# Patient Record
Sex: Female | Born: 1980 | Race: Black or African American | Hispanic: No | Marital: Married | Smoking: Former smoker
Health system: Southern US, Community
[De-identification: ages and names within clinical notes are randomized; demographics above are authoritative.]

## PROBLEM LIST (undated history)

## (undated) DIAGNOSIS — K219 Gastro-esophageal reflux disease without esophagitis: Secondary | ICD-10-CM

## (undated) DIAGNOSIS — F32A Depression, unspecified: Secondary | ICD-10-CM

## (undated) DIAGNOSIS — N898 Other specified noninflammatory disorders of vagina: Secondary | ICD-10-CM

## (undated) DIAGNOSIS — F329 Major depressive disorder, single episode, unspecified: Secondary | ICD-10-CM

## (undated) DIAGNOSIS — S83511A Sprain of anterior cruciate ligament of right knee, initial encounter: Secondary | ICD-10-CM

## (undated) DIAGNOSIS — D219 Benign neoplasm of connective and other soft tissue, unspecified: Secondary | ICD-10-CM

## (undated) DIAGNOSIS — F4310 Post-traumatic stress disorder, unspecified: Secondary | ICD-10-CM

## (undated) DIAGNOSIS — F419 Anxiety disorder, unspecified: Secondary | ICD-10-CM

## (undated) HISTORY — DX: Benign neoplasm of connective and other soft tissue, unspecified: D21.9

## (undated) HISTORY — DX: Other specified noninflammatory disorders of vagina: N89.8

## (undated) HISTORY — DX: Depression, unspecified: F32.A

## (undated) HISTORY — PX: WISDOM TOOTH EXTRACTION: SHX21

## (undated) HISTORY — DX: Major depressive disorder, single episode, unspecified: F32.9

## (undated) HISTORY — DX: Gastro-esophageal reflux disease without esophagitis: K21.9

## (undated) HISTORY — DX: Post-traumatic stress disorder, unspecified: F43.10

---

## 2000-07-09 ENCOUNTER — Other Ambulatory Visit: Admission: RE | Admit: 2000-07-09 | Discharge: 2000-07-09 | Payer: Self-pay | Admitting: Obstetrics & Gynecology

## 2000-07-09 ENCOUNTER — Encounter: Admission: RE | Admit: 2000-07-09 | Discharge: 2000-07-09 | Payer: Self-pay | Admitting: Obstetrics & Gynecology

## 2001-12-02 ENCOUNTER — Other Ambulatory Visit: Admission: RE | Admit: 2001-12-02 | Discharge: 2001-12-02 | Payer: Self-pay | Admitting: Obstetrics and Gynecology

## 2002-02-11 ENCOUNTER — Ambulatory Visit (HOSPITAL_COMMUNITY): Admission: RE | Admit: 2002-02-11 | Discharge: 2002-02-11 | Payer: Self-pay | Admitting: Obstetrics and Gynecology

## 2002-02-11 ENCOUNTER — Encounter: Payer: Self-pay | Admitting: Obstetrics and Gynecology

## 2002-03-03 ENCOUNTER — Inpatient Hospital Stay (HOSPITAL_COMMUNITY): Admission: AD | Admit: 2002-03-03 | Discharge: 2002-03-03 | Payer: Self-pay | Admitting: Obstetrics and Gynecology

## 2002-06-09 ENCOUNTER — Inpatient Hospital Stay (HOSPITAL_COMMUNITY): Admission: AD | Admit: 2002-06-09 | Discharge: 2002-06-12 | Payer: Self-pay | Admitting: Obstetrics and Gynecology

## 2003-07-26 ENCOUNTER — Other Ambulatory Visit: Admission: RE | Admit: 2003-07-26 | Discharge: 2003-07-26 | Payer: Self-pay | Admitting: Obstetrics and Gynecology

## 2004-08-09 ENCOUNTER — Other Ambulatory Visit: Admission: RE | Admit: 2004-08-09 | Discharge: 2004-08-09 | Payer: Self-pay | Admitting: Obstetrics and Gynecology

## 2006-01-09 ENCOUNTER — Other Ambulatory Visit: Admission: RE | Admit: 2006-01-09 | Discharge: 2006-01-09 | Payer: Self-pay | Admitting: Obstetrics and Gynecology

## 2007-12-30 ENCOUNTER — Emergency Department (HOSPITAL_COMMUNITY): Admission: EM | Admit: 2007-12-30 | Discharge: 2007-12-31 | Payer: Self-pay | Admitting: Emergency Medicine

## 2010-02-28 ENCOUNTER — Emergency Department (HOSPITAL_COMMUNITY): Admission: EM | Admit: 2010-02-28 | Discharge: 2010-02-28 | Payer: Self-pay | Admitting: Emergency Medicine

## 2010-08-17 ENCOUNTER — Encounter (INDEPENDENT_AMBULATORY_CARE_PROVIDER_SITE_OTHER): Payer: Self-pay | Admitting: *Deleted

## 2010-08-31 NOTE — Letter (Signed)
Summary: New Patient letter  [facility]  [address] [place], [place] [address]   Phone: [phone]  Fax: [phone]       08/17/2010 MRN: [id number]  Donna Aguilar [address] [place], [place]  [address]  Dear Ms. Donna Aguilar,  Welcome to the Gastroenterology Division at [facility].    You are scheduled to see Dr.  Russella Dar on 09-22-10 at 2:30p.m. on the 3rd floor at [facility], [address]  We ask that you try to arrive at our office 15 minutes prior to your appointment time to allow for check-in.  We would like you to complete the enclosed self-administered evaluation form prior to your visit and bring it with you on the day of your appointment.  We will review it with you.  Also, please bring a complete list of all your medications or, if you prefer, bring the medication bottles and we will list them.  Please bring your insurance card so that we may make a copy of it.  If your insurance requires a referral to see a specialist, please bring your referral form from your primary care physician.  Co-payments are due at the time of your visit and may be paid by cash, check or credit card.     Your office visit will consist of a consult with your physician (includes a physical exam), any laboratory testing he/she may order, scheduling of any necessary diagnostic testing (e.g. x-ray, ultrasound, CT-scan), and scheduling of a procedure (e.g. Endoscopy, Colonoscopy) if required.  Please allow enough time on your schedule to allow for any/all of these possibilities.    If you cannot keep your appointment, please call [phone] to cancel or reschedule prior to your appointment date.  This allows [place] the opportunity to schedule an appointment for another patient in need of care.  If you do not cancel or reschedule by 5 p.m. the business day prior to your appointment date, you will be charged a $50.00 late cancellation/no-show fee.    Thank you for choosing  [facility] for your medical needs.  We appreciate the opportunity to care for you.  Please visit [place] at our website  to learn more about our practice.                     Sincerely,                                                             The Gastroenterology Division

## 2010-09-22 ENCOUNTER — Ambulatory Visit (INDEPENDENT_AMBULATORY_CARE_PROVIDER_SITE_OTHER): Payer: BC Managed Care – PPO | Admitting: Gastroenterology

## 2010-09-22 ENCOUNTER — Encounter: Payer: Self-pay | Admitting: Gastroenterology

## 2010-09-22 DIAGNOSIS — K5900 Constipation, unspecified: Secondary | ICD-10-CM

## 2010-09-22 DIAGNOSIS — K219 Gastro-esophageal reflux disease without esophagitis: Secondary | ICD-10-CM

## 2010-09-26 NOTE — Assessment & Plan Note (Signed)
Summary: ACID REFLUX.Marland KitchenCH -- [facility] WITH PT//BCBS//MEDLIST//CX POLICY ADVI...   History of Present Illness Visit Type: Initial Visit Primary GI MD: Elie Goody MD [facility] Primary Provider: Jackalyn Lombard, MD Chief Complaint: acid reflux History of Present Illness:    This is a 30 year old female who relates an 8 year history of constipation.  When her constipation is problematic she notes miid, generalized abdominal pain. When her bowels are moving regularly she has no abdominal pain. She takes a laxative and enemas on a frequent basis to produce a bowel movement. For the past year she has noted problems with bloating, throat pain and bad breath. She began taking Prilosec and her symptoms resolved.  She takes Pepto-Bismol frequently for dyspeptic symptoms.   GI Review of Systems    Reports abdominal pain, belching, and  bloating.     Location of  Abdominal pain: generalized.    Denies acid reflux, chest pain, dysphagia with liquids, dysphagia with solids, heartburn, loss of appetite, nausea, vomiting, vomiting blood, weight loss, and  weight gain.      Reports change in bowel habits and  constipation.     Denies anal fissure, black tarry stools, diarrhea, diverticulosis, fecal incontinence, heme positive stool, hemorrhoids, irritable bowel syndrome, jaundice, light color stool, liver problems, rectal bleeding, and  rectal pain.   Current Medications (verified): 1)  Prilosec Otc 20 Mg Tbec (Omeprazole Magnesium) .Marland Kitchen.. 1 By Mouth Once Daily 2)  Colon Cleanser  Caps (Misc Natural Products) .... Once Per Week or As Needed 3)  Pepto-Bismol 262 Mg Chew (Bismuth Subsalicylate) .... As Needed 4)  Chlorafil .... Take As Directed Daily 5)  Moviprep 100 Gm  Solr (Peg-Kcl-Nacl-Nasulf-Na Asc-C) .... As Per Prep Instructions.  Allergies (verified): No Known Drug Allergies  Past History:  Past Medical History: anxiety depression  Past Surgical History: none  Family History: Reviewed history  and no changes required. No FH of Colon Cancer:  Social History: Reviewed history and no changes required. 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 1-2 cigs per day Alcohol Use - yes Daily Caffeine Use Illicit Drug Use - no Smoking Status:  current Drug Use:  no  Review of Systems       The patient complains of cough, sore throat, and voice change.         The pertinent positives and negatives are noted as above and in the HPI. All other ROS were reviewed and were negative.   Vital Signs:  Patient profile:   30 year old female Height:      65 inches Weight:      212 pounds BMI:     35.41 Pulse rate:   100 / minute BP sitting:   122 / 80  (left arm)  Vitals Entered By: Milford Cage NCMA (September 22, 2010 2:46 PM)  Physical Exam  General:  Well developed, well nourished, no acute distress. Head:  Normocephalic and atraumatic. Eyes:  PERRLA, no icterus. Ears:  Normal auditory acuity. Mouth:  No deformity or lesions, dentition normal. Neck:  Supple; no masses or thyromegaly. Chest Wall:  Symmetrical;  no deformities or tenderness. Lungs:  Clear throughout to auscultation. Heart:  Regular rate and rhythm; no murmurs, rubs,  or bruits. Abdomen:  Soft, nontender and nondistended. No masses, hepatosplenomegaly or hernias noted. Normal bowel sounds. Rectal:  perirectal mass.   Msk:  Symmetrical with no gross deformities. Normal posture. Pulses:  Normal pulses noted. Extremities:  No clubbing, cyanosis, edema or deformities noted. Neurologic:  Alert  and  oriented x4;  grossly normal neurologically. Cervical Nodes:  No significant cervical adenopathy. Inguinal Nodes:  No significant inguinal adenopathy. Psych:  Alert and cooperative. Normal mood and affect.  Impression & Recommendations:  Problem # 1:  CONSTIPATION (ICD-564.00)  Chronic constipation. Discontinue Pepto-Bismol, enemas and current laxative. Begin MiraLax once twice daily titrated for adequate bowel  movements. Long-term high fiber diet with adequate daily water intake. Rule out obstructing lesions. The risks, benefits and alternatives to colonoscopy with possible biopsy and possible polypectomy were discussed with the patient and they consent to proceed. The procedure will be scheduled electively. Orders: Colon/Endo (Colon/Endo)  Problem # 2:  GERD (ICD-530.81)  Suspected GERD. Atypical symptoms. Continue Prilosec 20 mg by mouth every morning and begin all standard antireflux measures. The risks, benefits and alternatives to endoscopy with possible biopsy and possible dilation were discussed with the patient and they consent to proceed. The procedure will be scheduled electively. Orders: Colon/Endo (Colon/Endo)  Patient Instructions: 1)  Colonoscopy brochure given.  2)  Upper Endoscopy brochure given.  3)  Discontinue all Laxatives that you are currently taking and also stop Pepto-Bismol.  4)  Start Miralax 17 grams in 8 oz of water 1-2 x daily.  5)  Continue Prilosec daily.  6)  Copy sent to :  Jackalyn Lombard, MD 7)  The medication list was reviewed and reconciled.  All changed / newly prescribed medications were explained.  A complete medication list was provided to the patient / caregiver. 8)  Avoid foods high in acid content ( tomatoes, citrus juices, spicy foods) . Avoid eating within 3 to 4 hours of lying down or before exercising. Do not over eat; try smaller more frequent meals. Elevate head of bed four inches when sleeping.   Prescriptions: MOVIPREP 100 GM  SOLR (PEG-KCL-NACL-NASULF-NA ASC-C) As per prep instructions.  #1 x 0   Entered by:   Christie Nottingham CMA (AAMA)   Authorized by:   Meryl Dare MD [facility]   Signed by:   Christie Nottingham CMA (AAMA) on 09/22/2010   Method used:   [facility]        [facility]  [address] (retail)       [address]       [place], [place]  [address]       Ph: [phone] or [phone]       Fax: [phone]   RxID:    [id number]

## 2010-09-26 NOTE — Letter (Signed)
Summary: [facility] Instructions  [facility]  [address] [place], [place] [address]   Phone: [phone]  Fax: [phone]       Donna Aguilar    03-May-1981    MRN: [id number]        Procedure Day /Date: Tuesday April 3rd, 2012     Arrival Time: 7:30am     Procedure Time: 8:30am     Location of Procedure:                    _x _  [facility] (4th Floor)                        PREPARATION FOR COLONOSCOPY WITH MOVIPREP   Starting 5 days prior to your procedure 10/26/10 do not eat nuts, seeds, popcorn, corn, beans, peas,  salads, or any raw vegetables.  Do not take any fiber supplements (e.g. Metamucil, Citrucel, and Benefiber).  THE DAY BEFORE YOUR PROCEDURE         DATE:10/30/10  DAY: Monday  1.  Drink clear liquids the entire day-NO SOLID FOOD  2.  Do not drink anything colored red or purple.  Avoid juices with pulp.  No orange juice.  3.  Drink at least 64 oz. (8 glasses) of fluid/clear liquids during the day to prevent dehydration and help the prep work efficiently.  CLEAR LIQUIDS INCLUDE: Water Jello Ice Popsicles Tea (sugar ok, no milk/cream) Powdered fruit flavored drinks Coffee (sugar ok, no milk/cream) Gatorade Juice: apple, white grape, white cranberry  Lemonade Clear bullion, consomm, broth Carbonated beverages (any kind) Strained chicken noodle soup Hard Candy                             4.  In the morning, mix first dose of MoviPrep solution:    Empty 1 Pouch A and 1 Pouch B into the disposable container    Add lukewarm drinking water to the top line of the container. Mix to dissolve    Refrigerate (mixed solution should be used within 24 hrs)  5.  Begin drinking the prep at 5:00 p.m. The MoviPrep container is divided by 4 marks.   Every 15 minutes drink the solution down to the next mark (approximately 8 oz) until the full liter is complete.   6.  Follow completed prep with 16 oz of clear liquid of your choice  (Nothing red or purple).  Continue to drink clear liquids until bedtime.  7.  Before going to bed, mix second dose of MoviPrep solution:    Empty 1 Pouch A and 1 Pouch B into the disposable container    Add lukewarm drinking water to the top line of the container. Mix to dissolve    Refrigerate  THE DAY OF YOUR PROCEDURE      DATE: 10/31/10 DAY: Tuesday  Beginning at 3:30 a.m. (5 hours before procedure):         1. Every 15 minutes, drink the solution down to the next mark (approx 8 oz) until the full liter is complete.  2. Follow completed prep with 16 oz. of clear liquid of your choice.    3. You may drink clear liquids until 6:30am (2 HOURS BEFORE PROCEDURE).   MEDICATION INSTRUCTIONS  Unless otherwise instructed, you should take regular prescription medications with a small sip of water   as early as possible the morning of your procedure.  OTHER INSTRUCTIONS  You will need a responsible adult at least 32 years of age to accompany you and drive you home.   This person must remain in the waiting room during your procedure.  Wear loose fitting clothing that is easily removed.  Leave jewelry and other valuables at home.  However, you may wish to bring a book to read or  an iPod/MP3 player to listen to music as you wait for your procedure to start.  Remove all body piercing jewelry and leave at home.  Total time from sign-in until discharge is approximately 2-3 hours.  You should go home directly after your procedure and rest.  You can resume normal activities the  day after your procedure.  The day of your procedure you should not:   Drive   Make legal decisions   Operate machinery   Drink alcohol   Return to work  You will receive specific instructions about eating, activities and medications before you leave.    The above instructions have been reviewed and explained to me by   Estill Bamberg.     I fully understand and can verbalize these  instructions _____________________________ Date _________

## 2010-10-13 LAB — POCT I-STAT, CHEM 8
BUN: 14 mg/dL (ref 6–23)
Calcium, Ion: 1.18 mmol/L (ref 1.12–1.32)
Chloride: 106 mEq/L (ref 96–112)
Creatinine, Ser: 0.9 mg/dL (ref 0.4–1.2)
Glucose, Bld: 75 mg/dL (ref 70–99)
HCT: 44 % (ref 36.0–46.0)
Hemoglobin: 15 g/dL (ref 12.0–15.0)
Potassium: 4.2 mEq/L (ref 3.5–5.1)
Sodium: 139 mEq/L (ref 135–145)
TCO2: 25 mmol/L (ref 0–100)

## 2010-10-27 ENCOUNTER — Encounter: Payer: Self-pay | Admitting: Gastroenterology

## 2010-10-27 NOTE — Telephone Encounter (Signed)
Error

## 2010-10-31 ENCOUNTER — Encounter: Payer: BC Managed Care – PPO | Admitting: Gastroenterology

## 2010-12-15 NOTE — H&P (Signed)
NAME:  Donna Aguilar, AZ[place] NO.:  [id number]   MEDICAL RECORD NO.:  [id number]                   PATIENT TYPE:  MAT   LOCATION:  [facility]                                 FACILITY:  [facility]   PHYSICIAN:  Osborn Coho, M.D.                DATE OF BIRTH:  1980/12/02   DATE OF ADMISSION:  06/09/2002  DATE OF DISCHARGE:                                HISTORY & PHYSICAL   HISTORY OF PRESENT ILLNESS:  The patient is a 30 year old gravida 3 para 0-0-  2-0 at 3 and four-sevenths weeks who presented with spontaneous rupture of  membranes at approximately 10:15 p.m., clear fluid, uterine contractions  every two to three minutes, mild to moderate quality.  She denies any  headache, visual symptoms, or epigastric pain.  She reports some mild  elevation of blood pressure over the last few visits in the office.  She did  have a 24-hour urine and PIH labs done on June 05, 2002 which were within  normal limits.  Cervix was 3 cm earlier this week.  Her pregnancy has been  remarkable for:  1. Positive group B strep.  2. Two TABs.  3. Family history of Downs.  4. First trimester spotting.  5. Obesity.  6. History of depression.  7. History of abnormal Pap.   PRENATAL LABORATORY DATA:  Blood type O positive, Rh antibody negative.  VDRL nonreactive.  Rubella titer positive.  Hepatitis B surface antigen  negative.  HIV nonreactive.  Sickle cell test was negative.  GC and  chlamydia cultures were normal.  Pap was normal.  Glucose challenge was  normal.  AFP was normal.  Cystic fibrosis testing was negative.  Hemoglobin  upon entering the practice was 12.2; it was 12.5 at 28 weeks.  EDC of  June 20, 2002 was established by last menstrual period and was in  agreement with ultrasound at approximately 18 weeks.  Group B strep culture  was positive at 36 weeks.   HISTORY OF PRESENT PREGNANCY:  The patient entered care at approximately 11  weeks.  We had originally planned to  have HSV-II glycoprotein testing done  secondary to questionable history of  HSV-type lesions in the past; this is  not available in the lab results.  The patient was having some visual  floaters and was seen by an ophthalmologist; it was normal.  She had an  ultrasound at 19 weeks that showed normal growth and fluid.  There were less  adequate views of the fetal heart.  She had some sporadic pelvic pressure  that was within normal limits.  The rest of her pregnancy was essentially  uncomplicated.  She did have some mild elevations of blood pressure in the  low 90s diastolic in the last two weeks with a normal 24-hour urine and PIH  labs.   OBSTETRICAL HISTORY:  In 1999 she had a therapeutic termination of pregnancy  at six weeks.  In 2000 she also had a therapeutic termination of pregnancy  at six weeks.   MEDICAL HISTORY:  She is on Ortho-Tricyclen, stopped in June 2001.  She had  an abnormal Pap in June 2001 and had a colposcopy but all her Paps have been  normal since then.  She was treated for chlamydia in 1993.  She has had  sporadic yeast infections.  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  There was no diagnostic workup done and none of these have  occurred during her pregnancy.  She reports the usual childhood illnesses.  She was lactose-intolerant before pregnancy but okay now.  She has had one  UTI in the past.   ALLERGIES:  No known medication allergies.   FAMILY HISTORY:  Maternal grandmother had a heart attack.  Her mother had  hypertension.  Maternal grandmother had hypertension.  Her mother had low  iron.  Her paternal grandmother had diabetes.   GENETIC HISTORY:  Remarkable for father-of-the-baby's cousin born with  Downs.  The patient does have a history of slight depression.  Her brother  has ADHD and her mother has chronic depression.  The patient has never been  on medication.   SOCIAL HISTORY:  The patient is single.  The father of the baby  is involved  and supportive.  His name is Arijana Narayan.  The patient is college  educated; she is also a Consulting civil engineer and works.  Her partner has a high school  education.  He is employed in the [employer].  She is [place]  American of the [place] faith.  She has been followed by the certified  nurse midwife service at [facility].  She denies any alcohol, drug,  or tobacco use during this pregnancy.   PHYSICAL EXAMINATION:  VITAL SIGNS:  Blood pressures range from 157 to 160  systolic over 98 to 112 diastolic on the three initial tracings.  Other  vital signs are stable.  HEENT:  Within normal limits.  LUNGS:  Bilateral breath sounds are clear.  HEART:  Regular rate and rhythm without murmur.  BREASTS:  Soft and nontender.  ABDOMEN:  Fundal height is approximately 40 cm, estimated fetal weight 7.5  to 8.5 pounds.  Uterine contractions are every two to four minutes, mild to  moderate quality.  Fetal heart rate is reactive with occasional mild  variables.  PELVIC:  Sterile speculum exam reveals positive pooling, positive fern,  positive nitrazine, with locks of hair visible at the cervical introitus.  Cervix is 3 cm, 80%, vertex, at a -1 station.  EXTREMITIES:  Deep tendon reflexes are 2+ without clonus.  There is 1-2+  edema in the lower extremities.   IMPRESSION:  1. Intrauterine pregnancy at 18 and four-sevenths weeks.  2. Spontaneous rupture of membranes in early labor.  3. Elevated blood pressure.  4. Positive group B strep.   PLAN:  1. Admit to birthing suite per consult with Dr. Su Hilt as attending     physician.  2. Routine certified nurse midwife orders.  3. Penicillin G for group B strep prophylaxis.  4. Check PIH labs and catheterized UA.  5. Plan epidural and close observation of blood pressure.     Renaldo Reel Emilee Hero, C.N.M.                   Osborn Coho, M.D.    VLL/MEDQ  D:  06/10/2002  T:  06/10/2002  Job:  [id number]

## 2011-04-26 LAB — POCT I-STAT, CHEM 8
BUN: 15
Calcium, Ion: 1 — ABNORMAL LOW
Chloride: 109
Creatinine, Ser: 1
Glucose, Bld: 81
HCT: 43
Hemoglobin: 14.6
Potassium: 3.6
Sodium: 138
TCO2: 22

## 2011-04-26 LAB — POCT PREGNANCY, URINE
Operator id: 294511
Preg Test, Ur: NEGATIVE

## 2011-04-26 LAB — URINALYSIS, ROUTINE W REFLEX MICROSCOPIC
Bilirubin Urine: NEGATIVE
Glucose, UA: NEGATIVE
Ketones, ur: 40 — AB
Protein, ur: NEGATIVE

## 2011-05-29 ENCOUNTER — Other Ambulatory Visit: Payer: Self-pay | Admitting: Internal Medicine

## 2011-05-29 ENCOUNTER — Other Ambulatory Visit (INDEPENDENT_AMBULATORY_CARE_PROVIDER_SITE_OTHER): Payer: BC Managed Care – PPO

## 2011-05-29 ENCOUNTER — Encounter: Payer: Self-pay | Admitting: Internal Medicine

## 2011-05-29 ENCOUNTER — Ambulatory Visit (INDEPENDENT_AMBULATORY_CARE_PROVIDER_SITE_OTHER): Payer: BC Managed Care – PPO | Admitting: Internal Medicine

## 2011-05-29 VITALS — BP 114/72 | HR 86 | Temp 98.6°F | Resp 16 | Ht 65.0 in | Wt 226.2 lb

## 2011-05-29 DIAGNOSIS — K219 Gastro-esophageal reflux disease without esophagitis: Secondary | ICD-10-CM

## 2011-05-29 DIAGNOSIS — Z23 Encounter for immunization: Secondary | ICD-10-CM

## 2011-05-29 DIAGNOSIS — E669 Obesity, unspecified: Secondary | ICD-10-CM

## 2011-05-29 DIAGNOSIS — Z0000 Encounter for general adult medical examination without abnormal findings: Secondary | ICD-10-CM

## 2011-05-29 LAB — URINALYSIS, ROUTINE W REFLEX MICROSCOPIC
Bilirubin Urine: NEGATIVE
Nitrite: NEGATIVE
Total Protein, Urine: NEGATIVE
Urobilinogen, UA: 0.2 (ref 0.0–1.0)

## 2011-05-29 LAB — COMPREHENSIVE METABOLIC PANEL
AST: 19 U/L (ref 0–37)
Albumin: 4.3 g/dL (ref 3.5–5.2)
BUN: 11 mg/dL (ref 6–23)
CO2: 27 mEq/L (ref 19–32)
Calcium: 8.9 mg/dL (ref 8.4–10.5)
Chloride: 105 mEq/L (ref 96–112)
Creatinine, Ser: 0.8 mg/dL (ref 0.4–1.2)
GFR: 114.91 mL/min (ref >60.00)
Glucose, Bld: 84 mg/dL (ref 70–99)
Potassium: 3.8 mEq/L (ref 3.5–5.1)

## 2011-05-29 LAB — CBC WITH DIFFERENTIAL/PLATELET
Basophils Relative: 0.5 % (ref 0.0–3.0)
Eosinophils Relative: 0.9 % (ref 0.0–5.0)
Hemoglobin: 13.7 g/dL (ref 12.0–15.0)
Lymphocytes Relative: 55.7 % — ABNORMAL HIGH (ref 12.0–46.0)
MCHC: 33 g/dL (ref 30.0–36.0)
Monocytes Relative: 6.2 % (ref 3.0–12.0)
Neutro Abs: 1.8 10*3/uL (ref 1.4–7.7)
Neutrophils Relative %: 36.7 % — ABNORMAL LOW (ref 43.0–77.0)
RBC: 4.51 Mil/uL (ref 3.87–5.11)
WBC: 4.9 10*3/uL (ref 4.5–10.5)

## 2011-05-29 LAB — LIPID PANEL
HDL: 75.6 mg/dL (ref >39.00)
Total CHOL/HDL Ratio: 3
VLDL: 11.6 mg/dL (ref 0.0–40.0)

## 2011-05-29 LAB — TSH: TSH: 1.34 u[IU]/mL (ref 0.35–5.50)

## 2011-05-29 MED ORDER — RABEPRAZOLE SODIUM 20 MG PO TBEC: 20.0000 mg | DELAYED_RELEASE_TABLET | Freq: Every day | ORAL | Status: DC

## 2011-05-29 NOTE — Patient Instructions (Signed)
Esophagitis Esophagitis (heartburn) is a painful, burning sensation in the chest. It may feel worse in certain positions, such as lying down or bending over. It is caused by stomach acid backing up into the tube that carries food from the mouth down to the stomach (lower esophagus). TREATMENT  There are a number of non-prescription medicines used to treat heartburn, including:  Antacids.   Acid reducers (also called H-2 blockers).   Proton-pump inhibitors.  HOME CARE INSTRUCTIONS   Raise the head of your bead by putting blocks under the legs.   Eat 2-3 hours before going to bed.   Stop smoking.   Try to reach and maintain a healthy weight.   Do not eat just a few very large meals. Instead, eat many smaller meals throughout the day.   Try to identify foods and beverages that make your symptoms worse, and avoid these.   Avoid tight clothing.   Do not exercise right after eating.  SEEK IMMEDIATE MEDICAL CARE IF:  You have severe chest pain that goes down your arm, or into your jaw or neck.   You feel sweaty, dizzy, or lightheaded.   You are short of breath.   You throw up (vomit) blood.   You have difficulty or pain with swallowing.   You have bloody or black, tarry stools.   You have bouts of heartburn more than three times a week for more than two weeks.  Document Released: 08/23/2004 Document Revised: 01/29/2011 Document Reviewed: 11/24/2008 ExitCare Patient Information 2012 [employer]. 

## 2011-05-30 ENCOUNTER — Encounter: Payer: Self-pay | Admitting: Internal Medicine

## 2011-05-30 DIAGNOSIS — E669 Obesity, unspecified: Secondary | ICD-10-CM | POA: Insufficient documentation

## 2011-05-30 NOTE — Progress Notes (Signed)
  Subjective:    Patient ID: Donna Aguilar, female    DOB: 02-May-1981, 30 y.o.   MRN: [id number]  Gastrophageal Reflux She complains of heartburn. She reports no abdominal pain, no belching, no chest pain, no choking, no coughing, no dysphagia, no early satiety, no globus sensation, no hoarse voice, no nausea, no sore throat, no stridor, no tooth decay, no water brash or no wheezing. This is a chronic problem. The current episode started more than 1 year ago. The problem occurs frequently. The problem has been gradually worsening. The heartburn duration is less than a minute. The heartburn is located in the substernum. The heartburn is of mild intensity. The heartburn does not wake her from sleep. The heartburn does not limit her activity. The heartburn doesn't change with position. The symptoms are aggravated by nothing. Pertinent negatives include no anemia, fatigue, melena, muscle weakness, orthopnea or weight loss. Risk factors include no known risk factors. She has tried a PPI (prilosec) for the symptoms. The treatment provided mild relief.      Review of Systems  Constitutional: Positive for unexpected weight change (weight gain). Negative for fever, chills, weight loss, diaphoresis, activity change, appetite change and fatigue.  HENT: Negative.  Negative for sore throat and hoarse voice.   Eyes: Negative.   Respiratory: Negative for cough, choking, wheezing and stridor.   Cardiovascular: Negative for chest pain, palpitations and leg swelling.  Gastrointestinal: Positive for heartburn and constipation. Negative for dysphagia, nausea, vomiting, abdominal pain, diarrhea, blood in stool, melena, abdominal distention and anal bleeding.  Genitourinary: Negative for dysuria, urgency, frequency, hematuria, flank pain, decreased urine volume, enuresis, difficulty urinating and dyspareunia.  Musculoskeletal: Negative for myalgias, back pain, joint swelling, arthralgias, gait problem and muscle  weakness.  Skin: Negative for color change, pallor, rash and wound.  Neurological: Negative for tremors, seizures, syncope, facial asymmetry, speech difficulty, weakness, light-headedness, numbness and headaches.  Hematological: Negative for adenopathy. Does not bruise/bleed easily.  Psychiatric/Behavioral: Negative.        Objective:   Physical Exam  Vitals reviewed. Constitutional: She is oriented to person, place, and time. She appears well-developed and well-nourished. No distress.  HENT:  Head: Normocephalic and atraumatic.  Mouth/Throat: Oropharynx is clear and moist. No oropharyngeal exudate.  Eyes: Conjunctivae are normal. Right eye exhibits no discharge. Left eye exhibits no discharge. No scleral icterus.  Neck: Normal range of motion. Neck supple. No JVD present. No tracheal deviation present. No thyromegaly present.  Cardiovascular: Normal rate, regular rhythm, normal heart sounds and intact distal pulses.  Exam reveals no gallop and no friction rub.   No murmur heard. Pulmonary/Chest: Effort normal and breath sounds normal. No stridor. No respiratory distress. She has no wheezes. She has no rales. She exhibits no tenderness.  Abdominal: Soft. Bowel sounds are normal. She exhibits no distension and no mass. There is no tenderness. There is no rebound and no guarding.  Musculoskeletal: Normal range of motion. She exhibits no edema and no tenderness.  Lymphadenopathy:    She has no cervical adenopathy.  Neurological: She is oriented to person, place, and time. She displays normal reflexes. She exhibits normal muscle tone. Coordination normal.  Skin: Skin is warm and dry. No rash noted. She is not diaphoretic. No erythema. No pallor.  Psychiatric: She has a normal mood and affect. Her behavior is normal. Judgment and thought content normal.          Assessment & Plan:

## 2011-05-30 NOTE — Assessment & Plan Note (Signed)
She asked me to write a letter for her to present to her employer stating that she wants to take 5 weeks off from work to address her eating habits and lack of time for exercise

## 2011-05-30 NOTE — Assessment & Plan Note (Signed)
Exam done, labs ordered, pt ed material was given, vaccines addressed

## 2011-05-30 NOTE — Assessment & Plan Note (Signed)
She is not getting much relief from prilosec so I changed her to aciphex

## 2011-10-12 ENCOUNTER — Ambulatory Visit (INDEPENDENT_AMBULATORY_CARE_PROVIDER_SITE_OTHER): Payer: BC Managed Care – PPO | Admitting: Internal Medicine

## 2011-10-12 VITALS — BP 118/80 | HR 90 | Temp 98.1°F | Resp 16 | Wt 237.0 lb

## 2011-10-12 DIAGNOSIS — K219 Gastro-esophageal reflux disease without esophagitis: Secondary | ICD-10-CM

## 2011-10-12 DIAGNOSIS — K5900 Constipation, unspecified: Secondary | ICD-10-CM

## 2011-10-12 MED ORDER — RABEPRAZOLE SODIUM 20 MG PO TBEC: 20.0000 mg | DELAYED_RELEASE_TABLET | Freq: Every day | ORAL | Status: DC

## 2011-10-12 MED ORDER — LINACLOTIDE 145 MCG PO CAPS: 1.0000 | ORAL_CAPSULE | Freq: Every day | ORAL | Status: DC

## 2011-10-12 NOTE — Progress Notes (Signed)
Subjective:    Patient ID: Donna Aguilar, female    DOB: 1980-09-07, 31 y.o.   MRN: [id number]  Gastrophageal Reflux She complains of heartburn. She reports no abdominal pain, no belching, no chest pain, no choking, no coughing, no dysphagia, no early satiety, no globus sensation, no hoarse voice, no nausea, no sore throat, no stridor, no tooth decay, no water brash or no wheezing. This is a chronic problem. The current episode started more than 1 year ago. The problem occurs rarely. The problem has been gradually improving. The heartburn duration is less than a minute. The heartburn is located in the substernum. The heartburn is of mild intensity. The heartburn does not wake her from sleep. The heartburn does not limit her activity. The heartburn doesn't change with position. The symptoms are aggravated by certain foods. Pertinent negatives include no anemia, fatigue, melena, muscle weakness, orthopnea or weight loss. She has tried a PPI for the symptoms. The treatment provided significant relief.  Constipation This is a chronic problem. The current episode started more than 1 year ago. The problem is unchanged. Her stool frequency is 1 time per day. The stool is described as firm and formed. The patient is on a high fiber diet. She exercises regularly. There has been adequate water intake. Associated symptoms include bloating. Pertinent negatives include no abdominal pain, anorexia, back pain, diarrhea, difficulty urinating, fecal incontinence, fever, flatus, hematochezia, hemorrhoids, melena, nausea, rectal pain, vomiting or weight loss. She has tried fiber, laxatives and stool softeners for the symptoms. The treatment provided mild relief.      Review of Systems  Constitutional: Negative for fever, chills, weight loss, diaphoresis, activity change, appetite change, fatigue and unexpected weight change.  HENT: Negative.  Negative for sore throat and hoarse voice.   Eyes: Negative.   Respiratory:  Negative for apnea, cough, choking, chest tightness, shortness of breath, wheezing and stridor.   Cardiovascular: Negative for chest pain, palpitations and leg swelling.  Gastrointestinal: Positive for heartburn, constipation, abdominal distention and bloating. Negative for dysphagia, nausea, vomiting, abdominal pain, diarrhea, blood in stool, melena, hematochezia, anal bleeding, rectal pain, anorexia, flatus and hemorrhoids.  Genitourinary: Negative.  Negative for difficulty urinating.  Musculoskeletal: Negative for myalgias, back pain, joint swelling, arthralgias, gait problem and muscle weakness.  Skin: Negative for color change, pallor, rash and wound.  Neurological: Negative.   Hematological: Negative for adenopathy. Does not bruise/bleed easily.  Psychiatric/Behavioral: Negative.        Objective:   Physical Exam  Vitals reviewed. Constitutional: She is oriented to person, place, and time. She appears well-developed and well-nourished. No distress.  HENT:  Head: Normocephalic and atraumatic.  Mouth/Throat: Oropharynx is clear and moist. No oropharyngeal exudate.  Eyes: Conjunctivae are normal. Right eye exhibits no discharge. Left eye exhibits no discharge. No scleral icterus.  Neck: Normal range of motion. Neck supple. No JVD present. No tracheal deviation present. No thyromegaly present.  Cardiovascular: Normal rate, regular rhythm, normal heart sounds and intact distal pulses.  Exam reveals no gallop and no friction rub.   No murmur heard. Pulmonary/Chest: Effort normal and breath sounds normal. No stridor. No respiratory distress. She has no wheezes. She has no rales. She exhibits no tenderness.  Abdominal: Soft. Bowel sounds are normal. She exhibits no distension and no mass. There is no tenderness. There is no rebound and no guarding.  Musculoskeletal: Normal range of motion. She exhibits no edema and no tenderness.  Lymphadenopathy:    She has no cervical adenopathy.  Neurological: She is oriented to person, place, and time.  Skin: Skin is warm and dry. No rash noted. She is not diaphoretic. No erythema. No pallor.  Psychiatric: She has a normal mood and affect. Her behavior is normal. Judgment and thought content normal.      Lab Results  Component Value Date   WBC 4.9 05/29/2011   HGB 13.7 05/29/2011   HCT 41.4 05/29/2011   PLT 222.0 05/29/2011   GLUCOSE 84 05/29/2011   CHOL 213* 05/29/2011   TRIG 58.0 05/29/2011   HDL 75.60 05/29/2011   LDLDIRECT 147.5 05/29/2011   ALT 15 05/29/2011   AST 19 05/29/2011   NA 139 05/29/2011   K 3.8 05/29/2011   CL 105 05/29/2011   CREATININE 0.8 05/29/2011   BUN 11 05/29/2011   CO2 27 05/29/2011   TSH 1.34 05/29/2011      Assessment & Plan:

## 2011-10-12 NOTE — Patient Instructions (Signed)
Place gastroesophageal reflux disease patient instructions here. Constipation in Adults Constipation is having fewer than 2 bowel movements per week. Usually, the stools are hard. As we grow older, constipation is more common. If you try to fix constipation with laxatives, the problem may get worse. This is because laxatives taken over a long period of time make the colon muscles weaker. A low-fiber diet, not taking in enough fluids, and taking some medicines may make these problems worse. MEDICATIONS THAT MAY CAUSE CONSTIPATION  Water pills (diuretics).   Calcium channel blockers (used to control blood pressure and for the heart).   Certain pain medicines (narcotics).   Anticholinergics.   Anti-inflammatory agents.   Antacids that contain aluminum.  DISEASES THAT CONTRIBUTE TO CONSTIPATION  Diabetes.   Parkinson's disease.   Dementia.   Stroke.   Depression.   Illnesses that cause problems with salt and water metabolism.  HOME CARE INSTRUCTIONS   Constipation is usually best cared for without medicines. Increasing dietary fiber and eating more fruits and vegetables is the best way to manage constipation.   Slowly increase fiber intake to 25 to 38 grams per day. Whole grains, fruits, vegetables, and legumes are good sources of fiber. A dietitian can further help you incorporate high-fiber foods into your diet.   Drink enough water and fluids to keep your urine clear or pale yellow.   A fiber supplement may be added to your diet if you cannot get enough fiber from foods.   Increasing your activities also helps improve regularity.   Suppositories, as suggested by your caregiver, will also help. If you are using antacids, such as aluminum or calcium containing products, it will be helpful to switch to products containing magnesium if your caregiver says it is okay.   If you have been given a liquid injection (enema) today, this is only a temporary measure. It should not be  relied on for treatment of longstanding (chronic) constipation.   Stronger measures, such as magnesium sulfate, should be avoided if possible. This may cause uncontrollable diarrhea. Using magnesium sulfate may not allow you time to make it to the bathroom.  SEEK IMMEDIATE MEDICAL CARE IF:   There is bright red blood in the stool.   The constipation stays for more than 4 days.   There is belly (abdominal) or rectal pain.   You do not seem to be getting better.   You have any questions or concerns.  MAKE SURE YOU:   Understand these instructions.   Will watch your condition.   Will get help right away if you are not doing well or get worse.  Document Released: 04/13/2004 Document Revised: 07/05/2011 Document Reviewed: 06/19/2011 [facility] Patient Information 2012 [place], [place].

## 2011-10-14 ENCOUNTER — Encounter: Payer: Self-pay | Admitting: Internal Medicine

## 2011-10-14 NOTE — Assessment & Plan Note (Signed)
She is doing well on aciphex

## 2011-10-14 NOTE — Assessment & Plan Note (Signed)
I think she would benefit from Linzess so I gave her samples and pt ed material as well

## 2011-10-15 ENCOUNTER — Encounter (INDEPENDENT_AMBULATORY_CARE_PROVIDER_SITE_OTHER): Payer: BC Managed Care – PPO | Admitting: Obstetrics and Gynecology

## 2011-10-15 DIAGNOSIS — Z8341 Family history of multiple endocrine neoplasia [MEN] syndrome: Secondary | ICD-10-CM

## 2011-10-15 DIAGNOSIS — N951 Menopausal and female climacteric states: Secondary | ICD-10-CM

## 2011-10-15 DIAGNOSIS — F520 Hypoactive sexual desire disorder: Secondary | ICD-10-CM

## 2011-10-15 DIAGNOSIS — R635 Abnormal weight gain: Secondary | ICD-10-CM

## 2012-01-21 ENCOUNTER — Other Ambulatory Visit: Payer: Self-pay | Admitting: Obstetrics and Gynecology

## 2012-01-21 NOTE — Telephone Encounter (Signed)
Triage/EP pt/

## 2012-01-21 NOTE — Telephone Encounter (Signed)
Lm on vm tcb rgd msg 

## 2012-01-24 NOTE — Telephone Encounter (Signed)
Lm on vm tcb rgd msg 

## 2012-01-29 NOTE — Telephone Encounter (Signed)
Lm on vm tcb rgd msg 

## 2012-02-20 ENCOUNTER — Encounter: Payer: BC Managed Care – PPO | Admitting: Obstetrics and Gynecology

## 2012-07-31 ENCOUNTER — Encounter: Payer: BC Managed Care – PPO | Admitting: Obstetrics and Gynecology

## 2012-08-20 ENCOUNTER — Encounter: Payer: Self-pay | Admitting: Obstetrics and Gynecology

## 2012-08-20 ENCOUNTER — Ambulatory Visit: Payer: BC Managed Care – PPO | Admitting: Obstetrics and Gynecology

## 2012-08-20 VITALS — BP 116/72 | HR 80 | Wt 227.0 lb

## 2012-08-20 DIAGNOSIS — Z30432 Encounter for removal of intrauterine contraceptive device: Secondary | ICD-10-CM

## 2012-08-20 NOTE — Progress Notes (Signed)
31 YO for IUD removal.   O: Pelvic:  EGBUS-wnl, vagina-normal; cervix-IUD removed per protocol without difficulty, uterus/adnexae-normal  A: IUD Removal  P: to use condoms while spouse pursues a vascectomy  Marsheila Alejo, PA-C

## 2012-09-28 ENCOUNTER — Other Ambulatory Visit: Payer: Self-pay | Admitting: Internal Medicine

## 2012-11-05 ENCOUNTER — Telehealth: Payer: Self-pay | Admitting: Internal Medicine

## 2012-11-05 NOTE — Telephone Encounter (Signed)
Patient needs a refill on her aciphex, she also thinks it requires a PA, she uses [facility]

## 2012-11-06 MED ORDER — RABEPRAZOLE SODIUM 20 MG PO TBEC: DELAYED_RELEASE_TABLET | ORAL | Status: DC

## 2012-11-06 NOTE — Telephone Encounter (Signed)
RX sent to pharmacy, PA completed

## 2013-07-15 ENCOUNTER — Encounter: Payer: Self-pay | Admitting: Internal Medicine

## 2013-07-15 ENCOUNTER — Ambulatory Visit (INDEPENDENT_AMBULATORY_CARE_PROVIDER_SITE_OTHER): Payer: BC Managed Care – PPO | Admitting: Internal Medicine

## 2013-07-15 ENCOUNTER — Ambulatory Visit (INDEPENDENT_AMBULATORY_CARE_PROVIDER_SITE_OTHER)
Admission: RE | Admit: 2013-07-15 | Discharge: 2013-07-15 | Disposition: A | Discharge status: Home / Self Care | Payer: BC Managed Care – PPO | Source: Ambulatory Visit | Attending: Internal Medicine | Admitting: Internal Medicine

## 2013-07-15 VITALS — BP 122/88 | HR 96 | Temp 98.1°F | Ht 65.0 in | Wt 226.5 lb

## 2013-07-15 DIAGNOSIS — J209 Acute bronchitis, unspecified: Secondary | ICD-10-CM

## 2013-07-15 MED ORDER — FLUCONAZOLE 150 MG PO TABS: 150.0000 mg | ORAL_TABLET | Freq: Once | ORAL | Status: DC

## 2013-07-15 MED ORDER — LEVOFLOXACIN 250 MG PO TABS: 250.0000 mg | ORAL_TABLET | Freq: Every day | ORAL | Status: DC

## 2013-07-15 MED ORDER — HYDROCODONE-HOMATROPINE 5-1.5 MG/5ML PO SYRP: 5.0000 mL | ORAL_SOLUTION | Freq: Four times a day (QID) | ORAL | Status: DC | PRN

## 2013-07-15 NOTE — Patient Instructions (Signed)
Please take all new medication as prescribed - the antibiotic, and cough medicine  Please continue all other medications as before Please have the pharmacy call with any other refills you may need.  Please go to the XRAY Department in the Basement (go straight as you get off the elevator) for the x-ray testing  You will be contacted by phone if any changes need to be made immediately.  Otherwise, you will receive a letter about your results with an explanation, but please check with MyChart first.  You are given the work note

## 2013-07-15 NOTE — Progress Notes (Signed)
Pre-visit discussion using our clinic review tool. No additional management support is needed unless otherwise documented below in the visit note.  

## 2013-07-15 NOTE — Assessment & Plan Note (Signed)
Mild to mod, for antibx course,  to f/u any worsening symptoms or concerns 

## 2013-07-15 NOTE — Progress Notes (Signed)
Subjective:    Patient ID: Donna Aguilar, female    DOB: January 29, 1981, 32 y.o.   MRN: [id number]  HPI  Here with 5 days onset mild prod cough, hoarseness, HA, fever, general weakness and malaise, after a previous illness somewhat similar 3 wks prior to that.  No sick contacts.  Pt denies chest pain, increased sob or doe, wheezing, orthopnea, PND, increased LE swelling, palpitations, dizziness or syncope. Pt denies new neurological symptoms such as new headache, or facial or extremity weakness or numbness   Pt denies polydipsia, polyuria, Past Medical History  Diagnosis Date  . Depression   . GERD (gastroesophageal reflux disease)   . PTSD (post-traumatic stress disorder)   . Vaginal dryness     h/o   Past Surgical History  Procedure Laterality Date  . Wisdom tooth extraction      reports that she quit smoking about 2 years ago. Her smoking use included Cigarettes. She has a 5 pack-year smoking history. She has never used smokeless tobacco. She reports that she drinks about 1.2 ounces of alcohol per week. She reports that she does not use illicit drugs. family history includes Arthritis in her other; Diabetes in her paternal grandmother; Heart attack in her maternal grandmother; Hyperlipidemia in her other; Hypertension in her maternal grandmother and mother; Mental illness in her other; Stroke in her other. No Known Allergies Current Outpatient Prescriptions on File Prior to Visit  Medication Sig Dispense Refill  . clonazePAM (KLONOPIN) 0.5 MG tablet Take 0.5 mg by mouth 2 (two) times daily.        Marland Kitchen levonorgestrel (MIRENA) 20 MCG/24HR IUD 1 Intra Uterine Device (1 each total) by Intrauterine route once.  1 each  0  . Probiotic Product (PROBIOTIC PO) Take by mouth.        . RABEprazole (ACIPHEX) 20 MG tablet TAKE 1 TABLET BY MOUTH EVERY DAY  90 tablet  3  . [DISCONTINUED] venlafaxine (EFFEXOR-XR) 75 MG 24 hr capsule Take 75 mg by mouth daily.         No current facility-administered  medications on file prior to visit.   Review of Systems  Constitutional: Negative for unexpected weight change, or unusual diaphoresis  HENT: Negative for tinnitus.   Eyes: Negative for photophobia and visual disturbance.  Respiratory: Negative for choking and stridor.   Gastrointestinal: Negative for vomiting and blood in stool.  Genitourinary: Negative for hematuria and decreased urine volume.  Musculoskeletal: Negative for acute joint swelling Skin: Negative for color change and wound.  Neurological: Negative for tremors and numbness other than noted  Psychiatric/Behavioral: Negative for decreased concentration or  hyperactivity.       Objective:   Physical Exam BP 122/88  Pulse 96  Temp(Src) 98.1 F (36.7 C) (Oral)  Ht 5\' 5"  (1.651 m)  Wt 226 lb 8 oz (102.74 kg)  BMI 37.69 kg/m2  SpO2 98%  LMP 06/20/2013 VS noted, mild ill Constitutional: Pt appears well-developed and well-nourished.  HENT: Head: NCAT.  Right Ear: External ear normal.  Left Ear: External ear normal.  Bilat tm's with mild erythema.  Max sinus areas non tender.  Pharynx with mild erythema, no exudate Eyes: Conjunctivae and EOM are normal. Pupils are equal, round, and reactive to light.  Neck: Normal range of motion. Neck supple.  Cardiovascular: Normal rate and regular rhythm.   Pulmonary/Chest: Effort normal and breath sounds normal.  Neurological: Pt is alert. Not confused  Skin: Skin is warm. No erythema.  Psychiatric: Pt behavior is normal.  Thought content normal.     Assessment & Plan:

## 2013-07-28 ENCOUNTER — Ambulatory Visit: Payer: BC Managed Care – PPO | Admitting: Internal Medicine

## 2013-07-29 ENCOUNTER — Encounter: Payer: Self-pay | Admitting: Internal Medicine

## 2013-07-29 ENCOUNTER — Ambulatory Visit: Payer: Self-pay

## 2013-07-29 ENCOUNTER — Ambulatory Visit (INDEPENDENT_AMBULATORY_CARE_PROVIDER_SITE_OTHER): Payer: BC Managed Care – PPO | Admitting: Internal Medicine

## 2013-07-29 VITALS — BP 128/88 | HR 92 | Temp 98.6°F | Resp 16 | Ht 65.0 in | Wt 229.0 lb

## 2013-07-29 DIAGNOSIS — K143 Hypertrophy of tongue papillae: Secondary | ICD-10-CM

## 2013-07-29 DIAGNOSIS — D729 Disorder of white blood cells, unspecified: Secondary | ICD-10-CM

## 2013-07-29 DIAGNOSIS — R109 Unspecified abdominal pain: Secondary | ICD-10-CM | POA: Insufficient documentation

## 2013-07-29 DIAGNOSIS — K219 Gastro-esophageal reflux disease without esophagitis: Secondary | ICD-10-CM

## 2013-07-29 LAB — TSH: TSH: 1.07 u[IU]/mL (ref 0.35–5.50)

## 2013-07-29 LAB — URINALYSIS, ROUTINE W REFLEX MICROSCOPIC
Ketones, ur: NEGATIVE
Leukocytes, UA: NEGATIVE
Nitrite: NEGATIVE
Specific Gravity, Urine: 1.02 (ref 1.000–1.030)
Urobilinogen, UA: 0.2 (ref 0.0–1.0)

## 2013-07-29 LAB — COMPREHENSIVE METABOLIC PANEL
Albumin: 4.2 g/dL (ref 3.5–5.2)
BUN: 10 mg/dL (ref 6–23)
CO2: 27 mEq/L (ref 19–32)
GFR: 106.81 mL/min (ref >60.00)
Glucose, Bld: 86 mg/dL (ref 70–99)
Potassium: 4.2 mEq/L (ref 3.5–5.1)
Sodium: 139 mEq/L (ref 135–145)
Total Bilirubin: 1.3 mg/dL — ABNORMAL HIGH (ref 0.3–1.2)
Total Protein: 7.4 g/dL (ref 6.0–8.3)

## 2013-07-29 LAB — CBC WITH DIFFERENTIAL/PLATELET
Basophils Relative: 0.6 % (ref 0.0–3.0)
Eosinophils Relative: 0.6 % (ref 0.0–5.0)
Lymphocytes Relative: 60.8 % — ABNORMAL HIGH (ref 12.0–46.0)
Monocytes Relative: 6 % (ref 3.0–12.0)
Neutrophils Relative %: 32 % — ABNORMAL LOW (ref 43.0–77.0)
Platelets: 239 10*3/uL (ref 150.0–400.0)
RBC: 4.56 Mil/uL (ref 3.87–5.11)
WBC: 5.3 10*3/uL (ref 4.5–10.5)

## 2013-07-29 MED ORDER — CHLORHEXIDINE GLUCONATE 0.12 % MT SOLN: 15.0000 mL | Freq: Two times a day (BID) | OROMUCOSAL | Status: DC

## 2013-07-29 NOTE — Progress Notes (Signed)
Pre visit review using our clinic review tool, if applicable. No additional management support is needed unless otherwise documented below in the visit note. 

## 2013-07-29 NOTE — Assessment & Plan Note (Signed)
Her tongue looks normal to me today She will try peridex to see of that helps

## 2013-07-29 NOTE — Assessment & Plan Note (Signed)
I have ordered an U/S to see if she has gallstones, her exam today is benign Will check labs as well to see if there is a secondary cause for this but my guess is that she has dyspepsia or IBS (she does not want med to treat this) Will check her HBP abs to see if she has this infection

## 2013-07-29 NOTE — Patient Instructions (Signed)

## 2013-07-29 NOTE — Progress Notes (Signed)
Subjective:    Patient ID: Donna Aguilar, female    DOB: 29-May-1981, 32 y.o.   MRN: [id number]  Abdominal Pain This is a chronic problem. The current episode started more than 1 year ago. The onset quality is gradual. The problem occurs intermittently. The problem has been unchanged. The pain is located in the generalized abdominal region. The pain is at a severity of 1/10. The pain is mild. The quality of the pain is aching (bloating, distended, crampy). The abdominal pain does not radiate. Pertinent negatives include no anorexia, arthralgias, belching, constipation, diarrhea, dysuria, fever, flatus, frequency, headaches, hematochezia, hematuria, melena, myalgias, nausea, vomiting or weight loss. The pain is aggravated by eating. The pain is relieved by bowel movements. She has tried nothing for the symptoms. Her past medical history is significant for GERD. There is no history of abdominal surgery, colon cancer, Crohn's disease, gallstones, irritable bowel syndrome, pancreatitis, PUD or ulcerative colitis.      Review of Systems  Constitutional: Negative.  Negative for fever, chills, weight loss, diaphoresis, activity change, appetite change, fatigue and unexpected weight change.  HENT: Negative.        She complains that she feels like there is a coating on her tongue and that she has bad breath.  Eyes: Negative.   Respiratory: Negative.  Negative for cough, shortness of breath, wheezing and stridor.   Cardiovascular: Negative.  Negative for chest pain, palpitations and leg swelling.  Gastrointestinal: Positive for abdominal pain. Negative for nausea, vomiting, diarrhea, constipation, blood in stool, melena, hematochezia, abdominal distention, anal bleeding, anorexia and flatus.  Endocrine: Negative.   Genitourinary: Negative.  Negative for dysuria, frequency and hematuria.  Musculoskeletal: Negative.  Negative for arthralgias and myalgias.  Skin: Negative.   Allergic/Immunologic:  Negative.   Neurological: Negative.  Negative for headaches.  Hematological: Negative.  Negative for adenopathy. Does not bruise/bleed easily.  Psychiatric/Behavioral: Negative.        Objective:   Physical Exam  Vitals reviewed. Constitutional: She is oriented to person, place, and time. She appears well-developed and well-nourished.  Non-toxic appearance. She does not have a sickly appearance. She does not appear ill. No distress.  HENT:  Head: Normocephalic and atraumatic.  Mouth/Throat: Uvula is midline, oropharynx is clear and moist and mucous membranes are normal. Mucous membranes are not pale, not dry and not cyanotic. No oral lesions. No trismus in the jaw. Normal dentition. No uvula swelling. No oropharyngeal exudate, posterior oropharyngeal edema, posterior oropharyngeal erythema or tonsillar abscesses.  Eyes: Conjunctivae are normal. Right eye exhibits no discharge. Left eye exhibits no discharge. No scleral icterus.  Neck: Normal range of motion. Neck supple. No JVD present. No tracheal deviation present. No thyromegaly present.  Cardiovascular: Normal rate, regular rhythm, normal heart sounds and intact distal pulses.  Exam reveals no gallop and no friction rub.   No murmur heard. Pulmonary/Chest: Effort normal and breath sounds normal. No stridor. No respiratory distress. She has no wheezes. She has no rales. She exhibits no tenderness.  Abdominal: Soft. Bowel sounds are normal. She exhibits no distension and no mass. There is no tenderness. There is no rebound and no guarding.  Musculoskeletal: Normal range of motion. She exhibits no edema and no tenderness.  Lymphadenopathy:    She has no cervical adenopathy.  Neurological: She is oriented to person, place, and time.  Skin: Skin is warm and dry. No rash noted. She is not diaphoretic. No erythema. No pallor.  Psychiatric: She has a normal mood and affect. Her  behavior is normal. Judgment and thought content normal.     Lab  Results  Component Value Date   WBC 4.9 05/29/2011   HGB 13.7 05/29/2011   HCT 41.4 05/29/2011   PLT 222.0 05/29/2011   GLUCOSE 84 05/29/2011   CHOL 213* 05/29/2011   TRIG 58.0 05/29/2011   HDL 75.60 05/29/2011   LDLDIRECT 147.5 05/29/2011   ALT 15 05/29/2011   AST 19 05/29/2011   NA 139 05/29/2011   K 3.8 05/29/2011   CL 105 05/29/2011   CREATININE 0.8 05/29/2011   BUN 11 05/29/2011   CO2 27 05/29/2011   TSH 1.34 05/29/2011       Assessment & Plan:

## 2013-07-31 ENCOUNTER — Encounter: Payer: Self-pay | Admitting: Internal Medicine

## 2013-07-31 LAB — H. PYLORI ANTIBODY, IGG: H Pylori IgG: 0.44 {ISR}

## 2013-07-31 LAB — PATHOLOGIST SMEAR REVIEW

## 2013-08-03 ENCOUNTER — Ambulatory Visit
Admission: RE | Admit: 2013-08-03 | Discharge: 2013-08-03 | Disposition: A | Discharge status: Home / Self Care | Payer: BC Managed Care – PPO | Source: Ambulatory Visit | Attending: Internal Medicine | Admitting: Internal Medicine

## 2013-08-03 ENCOUNTER — Encounter: Payer: Self-pay | Admitting: Internal Medicine

## 2013-08-03 DIAGNOSIS — R109 Unspecified abdominal pain: Secondary | ICD-10-CM

## 2014-01-13 LAB — HM PAP SMEAR

## 2014-02-16 ENCOUNTER — Other Ambulatory Visit: Payer: Self-pay | Admitting: Orthopedic Surgery

## 2014-02-17 ENCOUNTER — Encounter (HOSPITAL_BASED_OUTPATIENT_CLINIC_OR_DEPARTMENT_OTHER): Payer: Self-pay | Admitting: *Deleted

## 2014-02-21 NOTE — H&P (Signed)
Donna Aguilar is an 33 y.o. female.   Chief Complaint:  Right knee pain and buckling  POE:UMPNTIR returns today reporting continued buckling and giving away of her right knee, especially she tries to do any running or cutting or turning.  MRI scan is been accomplished showing medial meniscal tear posterior horn, as well as an absent ACL.  At age 88.  She wants to be more active and she is very interested in having an ACL reconstruction.  Past Medical History  Diagnosis Date  . Depression   . GERD (gastroesophageal reflux disease)   . PTSD (post-traumatic stress disorder)   . Vaginal dryness     h/o  . Anxiety   . Right ACL tear     Past Surgical History  Procedure Laterality Date  . Wisdom tooth extraction      Family History  Problem Relation Age of Onset  . Arthritis Other   . Hyperlipidemia Other   . Stroke Other   . Mental illness Other   . Hypertension Mother   . Heart attack Maternal Grandmother   . Hypertension Maternal Grandmother   . Diabetes Paternal Grandmother    Social History:  reports that she has been smoking Cigarettes.  She has a 5 pack-year smoking history. She has never used smokeless tobacco. She reports that she drinks about 1.2 ounces of alcohol per week. She reports that she does not use illicit drugs.  Allergies: No Known Allergies  No prescriptions prior to admission    No results found for this or any previous visit (from the past 48 hour(s)). No results found.  Review of Systems  Constitutional: Negative.   HENT: Negative.   Eyes: Negative.   Respiratory: Negative.   Cardiovascular: Negative.   Gastrointestinal: Negative.   Genitourinary: Negative.   Musculoskeletal: Positive for joint pain.  Skin: Negative.   Neurological: Negative.   Endo/Heme/Allergies: Negative.   Psychiatric/Behavioral: Negative.     Height 5\' 5"  (1.651 m), weight 86.183 kg (190 lb). Physical Exam  Constitutional: She is oriented to person, place, and time.  She appears well-developed and well-nourished.  HENT:  Head: Normocephalic and atraumatic.  Eyes: Pupils are equal, round, and reactive to light.  Neck: Normal range of motion. Neck supple.  Cardiovascular: Intact distal pulses.   Respiratory: Effort normal.  Musculoskeletal: She exhibits tenderness.  Range of motion of the right knee is 0/135.  Lachman's test is positive.  Collateral ligaments are stable.  She is tender along the medial joint line, 1+ McMurray's test is negative  Neurological: She is alert and oriented to person, place, and time.  Skin: Skin is warm and dry.  Psychiatric: She has a normal mood and affect. Her behavior is normal. Judgment and thought content normal.     Assessment/Plan Assess: 33 year old woman with complete ACL tear from 10 years ago now with a medial meniscal tear.  Plan: She would like to proceed with allograft reconstruction of the ACL and we will also address her meniscal pathology.  The risks and benefits of surgery were discussed.  I will see her back at the time of surgical intervention.  She may call us to reschedule as an autograft after doing some more research on the Internet.  She understands she'll let 15-20 visits of therapy after the surgery and regarding work, no running, jumping or cutting for 3 months.  [name] R 02/21/2014, 9:53 AM

## 2014-02-22 ENCOUNTER — Ambulatory Visit (HOSPITAL_BASED_OUTPATIENT_CLINIC_OR_DEPARTMENT_OTHER): Payer: BC Managed Care – PPO | Admitting: Anesthesiology

## 2014-02-22 ENCOUNTER — Encounter (HOSPITAL_BASED_OUTPATIENT_CLINIC_OR_DEPARTMENT_OTHER)
Admission: RE | Disposition: A | Discharge status: Home / Self Care | Payer: Self-pay | Source: Ambulatory Visit | Attending: Orthopedic Surgery

## 2014-02-22 ENCOUNTER — Encounter (HOSPITAL_BASED_OUTPATIENT_CLINIC_OR_DEPARTMENT_OTHER): Payer: BC Managed Care – PPO | Admitting: Anesthesiology

## 2014-02-22 ENCOUNTER — Encounter (HOSPITAL_BASED_OUTPATIENT_CLINIC_OR_DEPARTMENT_OTHER): Payer: Self-pay | Admitting: Anesthesiology

## 2014-02-22 ENCOUNTER — Ambulatory Visit (HOSPITAL_BASED_OUTPATIENT_CLINIC_OR_DEPARTMENT_OTHER)
Admission: RE | Admit: 2014-02-22 | Discharge: 2014-02-22 | Disposition: A | Discharge status: Home / Self Care | Payer: BC Managed Care – PPO | Source: Ambulatory Visit | Attending: Orthopedic Surgery | Admitting: Orthopedic Surgery

## 2014-02-22 DIAGNOSIS — F4310 Post-traumatic stress disorder, unspecified: Secondary | ICD-10-CM | POA: Insufficient documentation

## 2014-02-22 DIAGNOSIS — M2350 Chronic instability of knee, unspecified knee: Secondary | ICD-10-CM | POA: Insufficient documentation

## 2014-02-22 DIAGNOSIS — K219 Gastro-esophageal reflux disease without esophagitis: Secondary | ICD-10-CM | POA: Insufficient documentation

## 2014-02-22 DIAGNOSIS — F411 Generalized anxiety disorder: Secondary | ICD-10-CM | POA: Insufficient documentation

## 2014-02-22 DIAGNOSIS — F17200 Nicotine dependence, unspecified, uncomplicated: Secondary | ICD-10-CM | POA: Insufficient documentation

## 2014-02-22 DIAGNOSIS — S83511A Sprain of anterior cruciate ligament of right knee, initial encounter: Secondary | ICD-10-CM

## 2014-02-22 DIAGNOSIS — F3289 Other specified depressive episodes: Secondary | ICD-10-CM | POA: Insufficient documentation

## 2014-02-22 DIAGNOSIS — M23305 Other meniscus derangements, unspecified medial meniscus, unspecified knee: Secondary | ICD-10-CM | POA: Insufficient documentation

## 2014-02-22 DIAGNOSIS — F329 Major depressive disorder, single episode, unspecified: Secondary | ICD-10-CM | POA: Insufficient documentation

## 2014-02-22 HISTORY — DX: Sprain of anterior cruciate ligament of right knee, initial encounter: S83.511A

## 2014-02-22 HISTORY — DX: Anxiety disorder, unspecified: F41.9

## 2014-02-22 HISTORY — PX: KNEE ARTHROSCOPY WITH ANTERIOR CRUCIATE LIGAMENT (ACL) REPAIR: SHX5644

## 2014-02-22 LAB — POCT HEMOGLOBIN-HEMACUE: Hemoglobin: 12.6 g/dL (ref 12.0–15.0)

## 2014-02-22 SURGERY — KNEE ARTHROSCOPY WITH ANTERIOR CRUCIATE LIGAMENT (ACL) REPAIR
Anesthesia: General | Laterality: Right

## 2014-02-22 MED ORDER — HYDROMORPHONE HCL PF 1 MG/ML IJ SOLN
INTRAMUSCULAR | Status: AC
  Filled 2014-02-22: qty 1

## 2014-02-22 MED ORDER — MIDAZOLAM HCL 5 MG/5ML IJ SOLN
INTRAMUSCULAR | Status: DC | PRN
  Administered 2014-02-22: 2 mg via INTRAVENOUS

## 2014-02-22 MED ORDER — CEFAZOLIN SODIUM-DEXTROSE 2-3 GM-% IV SOLR
INTRAVENOUS | Status: AC
  Filled 2014-02-22: qty 50

## 2014-02-22 MED ORDER — MIDAZOLAM HCL 2 MG/2ML IJ SOLN
INTRAMUSCULAR | Status: AC
  Filled 2014-02-22: qty 2

## 2014-02-22 MED ORDER — CEFAZOLIN SODIUM-DEXTROSE 2-3 GM-% IV SOLR: 2.0000 g | INTRAVENOUS | Status: DC

## 2014-02-22 MED ORDER — CEFAZOLIN SODIUM-DEXTROSE 2-3 GM-% IV SOLR
INTRAVENOUS | Status: DC | PRN
  Administered 2014-02-22: 2 g via INTRAVENOUS

## 2014-02-22 MED ORDER — DEXAMETHASONE SODIUM PHOSPHATE 4 MG/ML IJ SOLN
INTRAMUSCULAR | Status: DC | PRN
  Administered 2014-02-22: 10 mg via INTRAVENOUS

## 2014-02-22 MED ORDER — FENTANYL CITRATE 0.05 MG/ML IJ SOLN
50.0000 ug | INTRAMUSCULAR | Status: DC | PRN
  Administered 2014-02-22: 100 ug via INTRAVENOUS

## 2014-02-22 MED ORDER — ONDANSETRON HCL 4 MG/2ML IJ SOLN: 4.0000 mg | Freq: Once | INTRAMUSCULAR | Status: DC | PRN

## 2014-02-22 MED ORDER — PROPOFOL 10 MG/ML IV BOLUS
INTRAVENOUS | Status: DC | PRN
  Administered 2014-02-22: 200 mg via INTRAVENOUS

## 2014-02-22 MED ORDER — BUPIVACAINE-EPINEPHRINE 0.5% -1:200000 IJ SOLN
INTRAMUSCULAR | Status: DC | PRN
  Administered 2014-02-22: 20 mL

## 2014-02-22 MED ORDER — FENTANYL CITRATE 0.05 MG/ML IJ SOLN
INTRAMUSCULAR | Status: DC | PRN
  Administered 2014-02-22 (×2): 50 ug via INTRAVENOUS

## 2014-02-22 MED ORDER — LACTATED RINGERS IV SOLN
INTRAVENOUS | Status: DC
  Administered 2014-02-22 (×2): via INTRAVENOUS

## 2014-02-22 MED ORDER — DEXTROSE-NACL 5-0.45 % IV SOLN: INTRAVENOUS | Status: DC

## 2014-02-22 MED ORDER — OXYCODONE HCL 5 MG PO TABS: 5.0000 mg | ORAL_TABLET | Freq: Once | ORAL | Status: DC | PRN

## 2014-02-22 MED ORDER — ONDANSETRON HCL 4 MG/2ML IJ SOLN
INTRAMUSCULAR | Status: DC | PRN
  Administered 2014-02-22: 4 mg via INTRAVENOUS

## 2014-02-22 MED ORDER — MIDAZOLAM HCL 2 MG/2ML IJ SOLN
1.0000 mg | INTRAMUSCULAR | Status: DC | PRN
  Administered 2014-02-22: 2 mg via INTRAVENOUS

## 2014-02-22 MED ORDER — HYDROMORPHONE HCL PF 1 MG/ML IJ SOLN
0.2500 mg | INTRAMUSCULAR | Status: DC | PRN
  Administered 2014-02-22 (×2): 0.5 mg via INTRAVENOUS

## 2014-02-22 MED ORDER — ROPIVACAINE HCL 5 MG/ML IJ SOLN
INTRAMUSCULAR | Status: DC | PRN
  Administered 2014-02-22 (×2): 10 mL via PERINEURAL

## 2014-02-22 MED ORDER — FENTANYL CITRATE 0.05 MG/ML IJ SOLN
INTRAMUSCULAR | Status: AC
  Filled 2014-02-22: qty 2

## 2014-02-22 MED ORDER — CHLORHEXIDINE GLUCONATE 4 % EX LIQD: 60.0000 mL | Freq: Once | CUTANEOUS | Status: DC

## 2014-02-22 MED ORDER — OXYCODONE HCL 5 MG/5ML PO SOLN: 5.0000 mg | Freq: Once | ORAL | Status: DC | PRN

## 2014-02-22 MED ORDER — OXYCODONE-ACETAMINOPHEN 5-325 MG PO TABS: 1.0000 | ORAL_TABLET | ORAL | Status: DC | PRN

## 2014-02-22 MED ORDER — SODIUM CHLORIDE 0.9 % IR SOLN
Status: DC | PRN
  Administered 2014-02-22: 14:00:00

## 2014-02-22 MED ORDER — BUPIVACAINE-EPINEPHRINE (PF) 0.5% -1:200000 IJ SOLN
INTRAMUSCULAR | Status: DC | PRN
  Administered 2014-02-22 (×2): 10 mL via PERINEURAL

## 2014-02-22 MED ORDER — FENTANYL CITRATE 0.05 MG/ML IJ SOLN
INTRAMUSCULAR | Status: AC
  Filled 2014-02-22: qty 6

## 2014-02-22 MED ORDER — LIDOCAINE HCL (CARDIAC) 20 MG/ML IV SOLN
INTRAVENOUS | Status: DC | PRN
  Administered 2014-02-22: 50 mg via INTRAVENOUS

## 2014-02-22 SURGICAL SUPPLY — 72 items
ACL SCREW ×3 IMPLANT
BANDAGE ELASTIC 4 VELCRO ST LF (GAUZE/BANDAGES/DRESSINGS) ×3 IMPLANT
BANDAGE ELASTIC 6 VELCRO ST LF (GAUZE/BANDAGES/DRESSINGS) ×3 IMPLANT
BANDAGE ESMARK 6X9 LF (GAUZE/BANDAGES/DRESSINGS) IMPLANT
BLADE AVERAGE 25MMX9MM (BLADE) ×1
BLADE AVERAGE 25X9 (BLADE) ×2 IMPLANT
BLADE CUTTER GATOR 3.5 (BLADE) IMPLANT
BLADE GREAT WHITE 4.2 (BLADE) ×2 IMPLANT
BLADE GREAT WHITE 4.2MM (BLADE) ×1
BLADE SURG 10 STRL SS (BLADE) IMPLANT
BLADE SURG 15 STRL LF DISP TIS (BLADE) ×1 IMPLANT
BLADE SURG 15 STRL SS (BLADE) ×2
BNDG COHESIVE 6X5 TAN STRL LF (GAUZE/BANDAGES/DRESSINGS) ×3 IMPLANT
BNDG ESMARK 6X9 LF (GAUZE/BANDAGES/DRESSINGS)
BUR VERTEX HOODED 4.5 (BURR) ×3 IMPLANT
COVER TABLE BACK 60X90 (DRAPES) ×3 IMPLANT
CUFF TOURNIQUET SINGLE 34IN LL (TOURNIQUET CUFF) IMPLANT
DECANTER SPIKE VIAL GLASS SM (MISCELLANEOUS) IMPLANT
DRAPE ARTHROSCOPY W/POUCH 114 (DRAPES) ×3 IMPLANT
DRAPE U-SHAPE 47X51 STRL (DRAPES) ×3 IMPLANT
DURAPREP 26ML APPLICATOR (WOUND CARE) ×3 IMPLANT
ELECT REM PT RETURN 9FT ADLT (ELECTROSURGICAL) ×3
ELECTRODE REM PT RTRN 9FT ADLT (ELECTROSURGICAL) ×1 IMPLANT
GAUZE SPONGE 4X4 12PLY STRL (GAUZE/BANDAGES/DRESSINGS) ×3 IMPLANT
GAUZE XEROFORM 1X8 LF (GAUZE/BANDAGES/DRESSINGS) ×3 IMPLANT
GLOVE BIO SURGEON STRL SZ7.5 (GLOVE) ×3 IMPLANT
GLOVE BIO SURGEON STRL SZ8.5 (GLOVE) ×3 IMPLANT
GLOVE BIOGEL PI IND STRL 7.0 (GLOVE) ×2 IMPLANT
GLOVE BIOGEL PI IND STRL 8 (GLOVE) ×1 IMPLANT
GLOVE BIOGEL PI IND STRL 9 (GLOVE) ×1 IMPLANT
GLOVE BIOGEL PI INDICATOR 7.0 (GLOVE) ×4
GLOVE BIOGEL PI INDICATOR 8 (GLOVE) ×2
GLOVE BIOGEL PI INDICATOR 9 (GLOVE) ×2
GLOVE ECLIPSE 6.5 STRL STRAW (GLOVE) ×3 IMPLANT
GOWN STRL REUS W/ TWL LRG LVL3 (GOWN DISPOSABLE) ×2 IMPLANT
GOWN STRL REUS W/TWL LRG LVL3 (GOWN DISPOSABLE) ×4
GOWN STRL REUS W/TWL XL LVL3 (GOWN DISPOSABLE) ×3 IMPLANT
IMMOBILIZER KNEE 22 UNIV (SOFTGOODS) IMPLANT
IMMOBILIZER KNEE 24 THIGH 36 (MISCELLANEOUS) ×1 IMPLANT
IMMOBILIZER KNEE 24 UNIV (MISCELLANEOUS) ×3
IV NS IRRIG 3000ML ARTHROMATIC (IV SOLUTION) ×9 IMPLANT
KNEE WRAP E Z 3 GEL PACK (MISCELLANEOUS) ×3 IMPLANT
MANIFOLD NEPTUNE II (INSTRUMENTS) ×3 IMPLANT
NDL SAFETY ECLIPSE 18X1.5 (NEEDLE) ×1 IMPLANT
NEEDLE HYPO 18GX1.5 SHARP (NEEDLE) ×2
NS IRRIG 1000ML POUR BTL (IV SOLUTION) IMPLANT
PACK ARTHROSCOPY DSU (CUSTOM PROCEDURE TRAY) ×3 IMPLANT
PAD CAST 4YDX4 CTTN HI CHSV (CAST SUPPLIES) ×1 IMPLANT
PADDING CAST COTTON 4X4 STRL (CAST SUPPLIES) ×2
PATELLA LIGAMENT BISECTED FR (Tissue) ×3 IMPLANT
PENCIL BUTTON HOLSTER BLD 10FT (ELECTRODE) IMPLANT
SCREW PROPEL 8X20 (Screw) ×6 IMPLANT
SET ARTHROSCOPY TUBING (MISCELLANEOUS) ×2
SET ARTHROSCOPY TUBING LN (MISCELLANEOUS) ×1 IMPLANT
SHEET MEDIUM DRAPE 40X70 STRL (DRAPES) ×3 IMPLANT
SLEEVE SCD COMPRESS KNEE MED (MISCELLANEOUS) ×3 IMPLANT
SPONGE LAP 4X18 X RAY DECT (DISPOSABLE) ×3 IMPLANT
SUCTION FRAZIER TIP 10 FR DISP (SUCTIONS) IMPLANT
SUT FIBERWIRE #2 38 T-5 BLUE (SUTURE) ×3
SUT PDS AB 4-0 P3 18 (SUTURE) IMPLANT
SUT STEEL 5 (SUTURE) ×3 IMPLANT
SUT VIC AB 2-0 SH 27 (SUTURE)
SUT VIC AB 2-0 SH 27XBRD (SUTURE) IMPLANT
SUT VIC AB 3-0 PS1 18 (SUTURE) ×2
SUT VIC AB 3-0 PS1 18XBRD (SUTURE) ×1 IMPLANT
SUTURE FIBERWR #2 38 T-5 BLUE (SUTURE) ×1 IMPLANT
SYRINGE 6CC (MISCELLANEOUS) ×3 IMPLANT
TOWEL OR 17X24 6PK STRL BLUE (TOWEL DISPOSABLE) ×9 IMPLANT
TOWEL OR NON WOVEN STRL DISP B (DISPOSABLE) ×3 IMPLANT
WAND STAR VAC 90 (SURGICAL WAND) ×3 IMPLANT
WATER STERILE IRR 1000ML POUR (IV SOLUTION) ×3 IMPLANT
YANKAUER SUCT BULB TIP NO VENT (SUCTIONS) IMPLANT

## 2014-02-22 NOTE — Anesthesia Postprocedure Evaluation (Signed)
  Anesthesia Post-op Note  Patient: Donna Aguilar  Procedure(s) Performed: Procedure(s): RIGHT ANTERIOR CRUCIATE LIGAMENT (ACL) RECONSTRUCTION WITH ALLOGRAFT/PARTIAL ANTERIOR MEDIAL MENISECTOMY (Right)  Patient Location: PACU  Anesthesia Type:GA combined with regional for post-op pain  Level of Consciousness: awake, alert  and oriented  Airway and Oxygen Therapy: Patient Spontanous Breathing and Patient connected to face mask oxygen  Post-op Pain: mild  Post-op Assessment: Post-op Vital signs reviewed  Post-op Vital Signs: Reviewed  Last Vitals:  Filed Vitals:   02/22/14 1545  BP: 124/76  Pulse: 89  Temp:   Resp: 15    Complications: No apparent anesthesia complications

## 2014-02-22 NOTE — Anesthesia Procedure Notes (Addendum)
Anesthesia Regional Block:  Femoral nerve block  Pre-Anesthetic Checklist: ,, timeout performed, Correct Patient, Correct Site, Correct Laterality, Correct Procedure, Correct Position, site marked, Risks and benefits discussed,  Surgical consent,  Pre-op evaluation,  At surgeon's request and post-op pain management  Laterality: Right and Lower  Prep: chloraprep       Needles:  Injection technique: Single-shot  Needle Type: Echogenic Needle     Needle Length: 9cm 9 cm Needle Gauge: 21 and 21 G    Additional Needles:  Procedures: ultrasound guided (picture in chart) Femoral nerve block Narrative:  Start time: 02/22/2014 1:34 PM End time: 02/22/2014 1:37 PM Injection made incrementally with aspirations every 5 mL.  Performed by: Personally  Anesthesiologist: Lorrene Reid, MD   Anesthesia Regional Block:  Adductor canal block  Pre-Anesthetic Checklist: ,, timeout performed, Correct Patient, Correct Site, Correct Laterality, Correct Procedure, Correct Position, site marked, Risks and benefits discussed,  Surgical consent,  Pre-op evaluation,  At surgeon's request and post-op pain management  Laterality: Right and Lower  Prep: chloraprep       Needles:  Injection technique: Single-shot  Needle Type: Echogenic Needle     Needle Length: 9cm 9 cm Needle Gauge: 21 and 21 G    Additional Needles:  Procedures: ultrasound guided (picture in chart) Adductor canal block Narrative:  Start time: 02/22/2014 1:38 PM End time: 02/22/2014 1:43 PM Injection made incrementally with aspirations every 5 mL.  Performed by: Personally  Anesthesiologist: Lorrene Reid, MD   Procedure Name: LMA Insertion Date/Time: 02/22/2014 1:53 PM Performed by: Marrianne Mood Pre-anesthesia Checklist: Patient identified, Emergency Drugs available, Suction available and Patient being monitored Patient Re-evaluated:Patient Re-evaluated prior to inductionOxygen Delivery Method: Circle System  Utilized Preoxygenation: Pre-oxygenation with 100% oxygen Intubation Type: IV induction Ventilation: Mask ventilation without difficulty LMA: LMA inserted LMA Size: 4.0 Number of attempts: 1 Airway Equipment and Method: bite block Placement Confirmation: positive ETCO2 and breath sounds checked- equal and bilateral Tube secured with: Tape Dental Injury: Teeth and Oropharynx as per pre-operative assessment

## 2014-02-22 NOTE — Discharge Instructions (Addendum)
Anterior Cruciate Ligament Reconstruction, Care After Refer to this sheet in the next few weeks. These instructions provide you with information on caring for yourself after your procedure. Your health care provider may also give you specific instructions. Your treatment has been planned according to current medical practices, but problems sometimes occur. Call your health care provider if you have any problems or questions after your procedure. HOME CARE INSTRUCTIONS  To ease pain and swelling, apply ice to your knee, twice per day, for 2-3 days after your procedure:  Put ice in a plastic bag.  Place a towel between your skin and the bag.  Leave the ice on for 15 minutes.  Change dressings as directed.  Take over-the-counter or prescription medicines for pain, discomfort, or fever only as directed by your health care provider.  Use crutches or braces or splints as directed by your health care provider.  Do not exercise your leg unless instructed to do so by your health care provider. SEEK IMMEDIATE MEDICAL CARE IF:   You develop increased redness, swelling, or pain around your incision sites.  You have increased pain with movement of your knee.  You have a marked increase in swelling around your knee.  You have a lot of pain in your leg when you move your foot up and down at your ankle.  There is pus or any unusual drainage coming from your incision sites.  You develop a fever.  You notice a bad smell coming from your incision sites.  Any of your incisions break open (edges do not stay together) after sutures or staples have been removed. Document Released: 02/02/2005 Document Revised: 11/30/2013 Document Reviewed: 12/09/2011 [facility] Patient Information 2015 [place], [place]. This information is not intended to replace advice given to you by your health care provider. Make sure you discuss any questions you have with your health care provider.  Regional Anesthesia Blocks  1.  Numbness or the inability to move the "blocked" extremity may last from 3-48 hours after placement. The length of time depends on the medication injected and your individual response to the medication. If the numbness is not going away after 48 hours, call your surgeon.  2. The extremity that is blocked will need to be protected until the numbness is gone and the  Strength has returned. Because you cannot feel it, you will need to take extra care to avoid injury. Because it may be weak, you may have difficulty moving it or using it. You may not know what position it is in without looking at it while the block is in effect.  3. For blocks in the legs and feet, returning to weight bearing and walking needs to be done carefully. You will need to wait until the numbness is entirely gone and the strength has returned. You should be able to move your leg and foot normally before you try and bear weight or walk. You will need someone to be with you when you first try to ensure you do not fall and possibly risk injury.  4. Bruising and tenderness at the needle site are common side effects and will resolve in a few days.  5. Persistent numbness or new problems with movement should be communicated to the surgeon or the [place] [phone] [place] [phone]).   Post Anesthesia Home Care Instructions  Activity: Get plenty of rest for the remainder of the day. A responsible adult should stay with you for 24 hours following the procedure.  For the next 24 hours, DO NOT: -Drive a car -Paediatric nurse -Drink alcoholic beverages -Take any medication unless instructed by your physician -Make any legal decisions or sign important papers.  Meals: Start with liquid foods such as gelatin or soup. Progress to regular foods as tolerated. Avoid greasy, spicy, heavy foods. If nausea and/or vomiting occur, drink only clear liquids until the nausea and/or vomiting subsides. Call  your physician if vomiting continues.  Special Instructions/Symptoms: Your throat may feel dry or sore from the anesthesia or the breathing tube placed in your throat during surgery. If this causes discomfort, gargle with warm salt water. The discomfort should disappear within 24 hours.

## 2014-02-22 NOTE — Op Note (Signed)
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 Mayer Camel M.D.  Assist: Kerry Hough. Barton Dubois  (present throughout entire procedure and necessary for timely completion of the procedure) Anes: General LMA, femoral nerve block, have percent Marcaine with epinephrine local to  EBL: Minimal  Fluids: 800 cc   Indications: Patient has MRI proven chronic anterior cruciate ligament tear as well as radial tear the medial meniscus. She has episodes of buckling and falling even when walking around.. Pt has failed conservative treatment with anti-inflammatory medicines, physical therapy, and modified activites but still has buckling. Instability has persisted, medial joint line pain pain has recurred and patient desires elective arthroscopic evaluation and treatment of knee. Risks and benefits of surgery have been discussed and questions answered.  Procedure: Patient identified by arm band and taken to the operating room at the day surgery Center. The appropriate anesthetic monitors were attached, and General LMA anesthesia was induced without difficulty. Lateral post was applied to the table and the lower extremity was prepped and draped in usual sterile fashion from the ankle to the midthigh. Time out procedure was performed. We began the operation by making standard inferior lateral and inferior medial peripatellar portals with a #11 blade allowing introduction of the arthroscope through the inferior lateral portal and the out flow to the inferior medial portal. Pump pressure was set at 100 mmHg and diagnostic arthroscopy  revealed a normal patellofemoral joint. Moving into the medial compartment she did have a  radial tear the medial meniscus it was removed with small biters smoothing off the edges. Moving into the notch the she had a chronic anterior cruciate ligament tear and there was no ligament present. The lateral articular and meniscal cartilages were pristine. At the back table a 10 mm wide bone tendon bone allograft was prepared with a #2 FiberWire and the patellar bone plug and a 20-gauge steel wire and the tibial bone plug. A 2 mm superior lateral notchplasty was performed with a 4.5 mm hooded vortex bur. Using the inferomedial portal the Linvatec tibial pin positioning guide was placed at the posterior aspect of the anterior cruciate ligament footprint, set at 58 and a 1/2 cm incision was made on the medial anterior flare of the tibia for the entry point of the pin which was driven through the posterior aspect of the anterior cruciate ligament insertion. It was then overreamed with a 10 mm badger reamer and utilizing the tibial tunnel a 7 mm over-the-top guide was placed at the 10:00 position on the posterior superior lateral notch and a guidepin placed through the tibial tunnel and up into the femur to a depth of 45 mm. This was likewise overreamed with a 10 mm badger reamer and then a superior notch placed in the femoral tunnel and a medial notch and the tibial tunnel a Beath needle was then driven up through the tibial tunnel and femoral tunnel and out the superior lateral skin with the knee flexed at 50 and using a #2 FiberWire the graft was pulled through the tibial tunnel and the patellar bone plug into the femoral tunnel to full depth. The knee was taken through range of motion confirming isometry. The knee was then hyperflexed to 110. And using  a supplemental inferomedial portal a nitinol wire was placed up into the notch and the femoral tunnel followed by a tissue protector an 8 x 20 Linvatec propel titanium screw that locked to the patellar bone plug into the femoral tunnel. The knee was then  extended to 40 and reverse Lachman's maneuver applied tension was applied using the 20-gauge steel wire to the tibial bone plug a nitinol wire was placed in the tibial notch and the tibial bone plug locked into the tibia with another 8 x 20 Linvatec propel titanium screw. The knee was irrigated out normal saline solution. 20 cc of half percent Marcaine and epinephrine solution was injected into the portals and knee. A dressing of xerofoam 4 x 4 dressing sponges, web roll and an Ace wrap was applied. Because she did have a femoral nerve block in the immobilizer was applied for first 24 hours The patient was awakened extubated and taken to the recovery without difficulty.    Signed: Kerin Salen, MD

## 2014-02-22 NOTE — Anesthesia Preprocedure Evaluation (Signed)
Anesthesia Evaluation  Patient identified by MRN, date of birth, ID band Patient awake    Reviewed: Allergy & Precautions, H&P , NPO status , Patient's Chart, lab work & pertinent test results  Airway Mallampati: I TM Distance: >3 FB Neck ROM: Full    Dental  (+) Teeth Intact, Dental Advisory Given   Pulmonary Current Smoker,  breath sounds clear to auscultation        Cardiovascular Rhythm:Regular Rate:Normal     Neuro/Psych    GI/Hepatic GERD-  Medicated and Controlled,  Endo/Other    Renal/GU      Musculoskeletal   Abdominal   Peds  Hematology   Anesthesia Other Findings   Reproductive/Obstetrics                           Anesthesia Physical Anesthesia Plan  ASA: II  Anesthesia Plan: General   Post-op Pain Management:    Induction: Intravenous  Airway Management Planned: LMA  Additional Equipment:   Intra-op Plan:   Post-operative Plan: Extubation in OR  Informed Consent: I have reviewed the patients History and Physical, chart, labs and discussed the procedure including the risks, benefits and alternatives for the proposed anesthesia with the patient or authorized representative who has indicated his/her understanding and acceptance.   Dental advisory given  Plan Discussed with: CRNA, Anesthesiologist and Surgeon  Anesthesia Plan Comments:         Anesthesia Quick Evaluation

## 2014-02-22 NOTE — Progress Notes (Signed)
Assisted Dr. Al Corpus with right, ultrasound guided, femoral, adductor canal block. Side rails up, monitors on throughout procedure. See vital signs in flow sheet. Tolerated Procedure well.

## 2014-02-22 NOTE — Transfer of Care (Signed)
Immediate Anesthesia Transfer of Care Note  Patient: Donna Aguilar  Procedure(s) Performed: Procedure(s): RIGHT ANTERIOR CRUCIATE LIGAMENT (ACL) RECONSTRUCTION WITH ALLOGRAFT/PARTIAL ANTERIOR MEDIAL MENISECTOMY (Right)  Patient Location: PACU  Anesthesia Type:GA combined with regional for post-op pain  Level of Consciousness: awake, alert  and oriented  Airway & Oxygen Therapy: Patient Spontanous Breathing and Patient connected to face mask oxygen  Post-op Assessment: Report given to PACU RN and Post -op Vital signs reviewed and stable  Post vital signs: Reviewed and stable  Complications: No apparent anesthesia complications

## 2014-02-22 NOTE — Interval H&P Note (Signed)
History and Physical Interval Note:  02/22/2014 12:53 PM  Donna Aguilar  has presented today for surgery, with the diagnosis of RIGHT ACL TEAR/MEDIAL MENISCUS TEAR  The various methods of treatment have been discussed with the patient and family. After consideration of risks, benefits and other options for treatment, the patient has consented to  Procedure(s): RIGHT ANTERIOR CRUCIATE LIGAMENT (ACL) RECONSTRUCTION WITH ALLOGRAFT/PARTIAL ANTERIOR MEDIAL MENISECTOMY (Right) as a surgical intervention .  The patient's history has been reviewed, patient examined, no change in status, stable for surgery.  I have reviewed the patient's chart and labs.  Questions were answered to the patient's satisfaction.     Kerin Salen

## 2014-02-23 ENCOUNTER — Emergency Department (HOSPITAL_COMMUNITY)
Admission: EM | Admit: 2014-02-23 | Discharge: 2014-02-23 | Discharge status: Against Medical Advice | Payer: BC Managed Care – PPO | Attending: Emergency Medicine | Admitting: Emergency Medicine

## 2014-02-23 ENCOUNTER — Encounter (HOSPITAL_BASED_OUTPATIENT_CLINIC_OR_DEPARTMENT_OTHER): Payer: Self-pay | Admitting: Orthopedic Surgery

## 2014-02-23 DIAGNOSIS — F17200 Nicotine dependence, unspecified, uncomplicated: Secondary | ICD-10-CM | POA: Insufficient documentation

## 2014-02-23 DIAGNOSIS — I959 Hypotension, unspecified: Secondary | ICD-10-CM | POA: Insufficient documentation

## 2014-02-23 LAB — CBG MONITORING, ED: Glucose-Capillary: 75 mg/dL (ref 70–99)

## 2014-02-23 NOTE — ED Notes (Addendum)
The pt refused lab work and states "i just want to go home. i feel better." encouraged pt to stay for medical screening. She states she is going home and will call her doctor tomorrow. Husband took the pt home

## 2014-02-23 NOTE — ED Notes (Signed)
Pt states she had outpatient ACL surgery yesterday. She states she has not eaten well since and she felt very lightheaded when she got up today. Her husband called ems to transport her to hospital. She states she is feeling a little better now but her knee is hurting. she took a percocet around 1400.

## 2014-05-23 ENCOUNTER — Emergency Department (HOSPITAL_COMMUNITY)
Admission: EM | Admit: 2014-05-23 | Discharge: 2014-05-24 | Disposition: A | Discharge status: Home / Self Care | Payer: BC Managed Care – PPO | Attending: Emergency Medicine | Admitting: Emergency Medicine

## 2014-05-23 ENCOUNTER — Encounter (HOSPITAL_COMMUNITY): Payer: Self-pay | Admitting: Emergency Medicine

## 2014-05-23 DIAGNOSIS — Z720 Tobacco use: Secondary | ICD-10-CM | POA: Insufficient documentation

## 2014-05-23 DIAGNOSIS — Z8719 Personal history of other diseases of the digestive system: Secondary | ICD-10-CM | POA: Insufficient documentation

## 2014-05-23 DIAGNOSIS — R5383 Other fatigue: Secondary | ICD-10-CM | POA: Diagnosis not present

## 2014-05-23 DIAGNOSIS — Z79899 Other long term (current) drug therapy: Secondary | ICD-10-CM | POA: Diagnosis not present

## 2014-05-23 DIAGNOSIS — M542 Cervicalgia: Secondary | ICD-10-CM | POA: Insufficient documentation

## 2014-05-23 DIAGNOSIS — Z8659 Personal history of other mental and behavioral disorders: Secondary | ICD-10-CM | POA: Insufficient documentation

## 2014-05-23 DIAGNOSIS — R42 Dizziness and giddiness: Secondary | ICD-10-CM | POA: Diagnosis not present

## 2014-05-23 DIAGNOSIS — Z3202 Encounter for pregnancy test, result negative: Secondary | ICD-10-CM | POA: Insufficient documentation

## 2014-05-23 DIAGNOSIS — R531 Weakness: Secondary | ICD-10-CM | POA: Diagnosis not present

## 2014-05-23 DIAGNOSIS — Z8742 Personal history of other diseases of the female genital tract: Secondary | ICD-10-CM | POA: Insufficient documentation

## 2014-05-23 DIAGNOSIS — R51 Headache: Secondary | ICD-10-CM | POA: Insufficient documentation

## 2014-05-23 LAB — URINE MICROSCOPIC-ADD ON

## 2014-05-23 LAB — URINALYSIS, ROUTINE W REFLEX MICROSCOPIC
BILIRUBIN URINE: NEGATIVE
Glucose, UA: NEGATIVE mg/dL
KETONES UR: NEGATIVE mg/dL
Leukocytes, UA: NEGATIVE
NITRITE: NEGATIVE
PROTEIN: NEGATIVE mg/dL
Specific Gravity, Urine: 1.013 (ref 1.005–1.030)
Urobilinogen, UA: 0.2 mg/dL (ref 0.0–1.0)
pH: 6 (ref 5.0–8.0)

## 2014-05-23 LAB — POC URINE PREG, ED: PREG TEST UR: NEGATIVE

## 2014-05-23 NOTE — ED Notes (Signed)
Pt reports feeling lethargic and "not myself" x 1 week. Two days ago started having dizzy spells and intermittent SOB. R side neck pain and swelling onset Thursday last week, lasted a couple hours then went away. Neck pain and swelling returned today after pt had near syncopal episode. Pt states pain is throbbing and radiates from R neck into head. Pt denies any known injury. Pt states she is concerned about possible blood clot, no hx of blood clots. Lung sounds clear.

## 2014-05-24 ENCOUNTER — Emergency Department (HOSPITAL_COMMUNITY): Payer: BC Managed Care – PPO

## 2014-05-24 ENCOUNTER — Encounter (HOSPITAL_COMMUNITY): Payer: Self-pay

## 2014-05-24 LAB — COMPREHENSIVE METABOLIC PANEL
ALBUMIN: 4.1 g/dL (ref 3.5–5.2)
ALK PHOS: 57 U/L (ref 39–117)
ALT: 10 U/L (ref 0–35)
ANION GAP: 11 (ref 5–15)
AST: 18 U/L (ref 0–37)
BILIRUBIN TOTAL: 0.5 mg/dL (ref 0.3–1.2)
BUN: 9 mg/dL (ref 6–23)
CHLORIDE: 101 meq/L (ref 96–112)
CO2: 24 mEq/L (ref 19–32)
Calcium: 9.1 mg/dL (ref 8.4–10.5)
Creatinine, Ser: 0.86 mg/dL (ref 0.50–1.10)
GFR calc Af Amer: >90 mL/min (ref >90)
GFR calc non Af Amer: 87 mL/min — ABNORMAL LOW (ref >90)
Glucose, Bld: 92 mg/dL (ref 70–99)
POTASSIUM: 4.5 meq/L (ref 3.7–5.3)
SODIUM: 136 meq/L — AB (ref 137–147)
Total Protein: 7.6 g/dL (ref 6.0–8.3)

## 2014-05-24 LAB — CBC WITH DIFFERENTIAL/PLATELET
BASOS ABS: 0 10*3/uL (ref 0.0–0.1)
Basophils Relative: 0 % (ref 0–1)
Eosinophils Absolute: 0 10*3/uL (ref 0.0–0.7)
Eosinophils Relative: 1 % (ref 0–5)
HCT: 43 % (ref 36.0–46.0)
Hemoglobin: 14.1 g/dL (ref 12.0–15.0)
LYMPHS PCT: 62 % — AB (ref 12–46)
Lymphs Abs: 3.7 10*3/uL (ref 0.7–4.0)
MCH: 29.1 pg (ref 26.0–34.0)
MCHC: 32.8 g/dL (ref 30.0–36.0)
MCV: 88.7 fL (ref 78.0–100.0)
Monocytes Absolute: 0.3 10*3/uL (ref 0.1–1.0)
Monocytes Relative: 5 % (ref 3–12)
NEUTROS ABS: 1.9 10*3/uL (ref 1.7–7.7)
NEUTROS PCT: 32 % — AB (ref 43–77)
PLATELETS: 201 10*3/uL (ref 150–400)
RBC: 4.85 MIL/uL (ref 3.87–5.11)
RDW: 13.9 % (ref 11.5–15.5)
WBC: 5.9 10*3/uL (ref 4.0–10.5)

## 2014-05-24 LAB — I-STAT TROPONIN, ED: TROPONIN I, POC: 0 ng/mL (ref 0.00–0.08)

## 2014-05-24 MED ORDER — METHOCARBAMOL 500 MG PO TABS: 500.0000 mg | ORAL_TABLET | Freq: Two times a day (BID) | ORAL | Status: DC

## 2014-05-24 MED ORDER — TRAMADOL HCL 50 MG PO TABS: 50.0000 mg | ORAL_TABLET | Freq: Four times a day (QID) | ORAL | Status: DC | PRN

## 2014-05-24 MED ORDER — IOHEXOL 350 MG/ML SOLN
100.0000 mL | Freq: Once | INTRAVENOUS | Status: AC | PRN
  Administered 2014-05-24: 100 mL via INTRAVENOUS

## 2014-05-24 NOTE — Discharge Instructions (Signed)
Take Tramadol as needed for pain. Take Robaxin as needed for muscle spasm. Follow up with your doctor for further evaluation.

## 2014-05-24 NOTE — ED Provider Notes (Signed)
Medical screening examination/treatment/procedure(s) were performed by non-physician practitioner and as supervising physician I was immediately available for consultation/collaboration.   Delora Fuel, MD [id number]

## 2014-05-24 NOTE — ED Provider Notes (Signed)
1:21 AM Patient signed out to me by Jeannett Senior, PA-C. Patient pending CT neck to rule out arterial dissection.   3:14 AM Patient's CT scan is negative for acute changes. Patient will be discharged with pain medication. No further evaluation needed at this time.   Results for orders placed during the hospital encounter of 05/23/14  CBC WITH DIFFERENTIAL      Result Value Ref Range   WBC 5.9  4.0 - 10.5 K/uL   RBC 4.85  3.87 - 5.11 MIL/uL   Hemoglobin 14.1  12.0 - 15.0 g/dL   HCT 43.0  36.0 - 46.0 %   MCV 88.7  78.0 - 100.0 fL   MCH 29.1  26.0 - 34.0 pg   MCHC 32.8  30.0 - 36.0 g/dL   RDW 13.9  11.5 - 15.5 %   Platelets 201  150 - 400 K/uL   Neutrophils Relative % 32 (*) 43 - 77 %   Neutro Abs 1.9  1.7 - 7.7 K/uL   Lymphocytes Relative 62 (*) 12 - 46 %   Lymphs Abs 3.7  0.7 - 4.0 K/uL   Monocytes Relative 5  3 - 12 %   Monocytes Absolute 0.3  0.1 - 1.0 K/uL   Eosinophils Relative 1  0 - 5 %   Eosinophils Absolute 0.0  0.0 - 0.7 K/uL   Basophils Relative 0  0 - 1 %   Basophils Absolute 0.0  0.0 - 0.1 K/uL  URINALYSIS, ROUTINE W REFLEX MICROSCOPIC      Result Value Ref Range   Color, Urine YELLOW  YELLOW   APPearance CLEAR  CLEAR   Specific Gravity, Urine 1.013  1.005 - 1.030   pH 6.0  5.0 - 8.0   Glucose, UA NEGATIVE  NEGATIVE mg/dL   Hgb urine dipstick SMALL (*) NEGATIVE   Bilirubin Urine NEGATIVE  NEGATIVE   Ketones, ur NEGATIVE  NEGATIVE mg/dL   Protein, ur NEGATIVE  NEGATIVE mg/dL   Urobilinogen, UA 0.2  0.0 - 1.0 mg/dL   Nitrite NEGATIVE  NEGATIVE   Leukocytes, UA NEGATIVE  NEGATIVE  URINE MICROSCOPIC-ADD ON      Result Value Ref Range   Squamous Epithelial / LPF RARE  RARE   WBC, UA 0-2  <3 WBC/hpf   RBC / HPF 3-6  <3 RBC/hpf   Bacteria, UA RARE  RARE   Urine-Other MUCOUS PRESENT    COMPREHENSIVE METABOLIC PANEL      Result Value Ref Range   Sodium 136 (*) 137 - 147 mEq/L   Potassium 4.5  3.7 - 5.3 mEq/L   Chloride 101  96 - 112 mEq/L   CO2 24  19 - 32  mEq/L   Glucose, Bld 92  70 - 99 mg/dL   BUN 9  6 - 23 mg/dL   Creatinine, Ser 0.86  0.50 - 1.10 mg/dL   Calcium 9.1  8.4 - 10.5 mg/dL   Total Protein 7.6  6.0 - 8.3 g/dL   Albumin 4.1  3.5 - 5.2 g/dL   AST 18  0 - 37 U/L   ALT 10  0 - 35 U/L   Alkaline Phosphatase 57  39 - 117 U/L   Total Bilirubin 0.5  0.3 - 1.2 mg/dL   GFR calc non Af Amer 87 (*) >90 mL/min   GFR calc Af Amer >90  >90 mL/min   Anion gap 11  5 - 15  POC URINE PREG, ED      Result  Value Ref Range   Preg Test, Ur NEGATIVE  NEGATIVE   Ct Angio Neck W/cm &/or Wo/cm  05/24/2014   CLINICAL DATA:  Initial evaluation for right neck pain and swelling.  EXAM: CT ANGIOGRAPHY NECK  TECHNIQUE: Multidetector CT imaging of the neck was performed using the standard protocol during bolus administration of intravenous contrast. Multiplanar CT image reconstructions and MIPs were obtained to evaluate the vascular anatomy. Carotid stenosis measurements (when applicable) are obtained utilizing NASCET criteria, using the distal internal carotid diameter as the denominator.  CONTRAST:  173mL OMNIPAQUE IOHEXOL 350 MG/ML SOLN  COMPARISON:  None available.  FINDINGS: The visualized aortic arch is of normal caliber in appearance with normal 3 vessel morphology. No high-grade stenosis seen at the origin of the great vessels. Subclavian arteries within normal limits.  The common carotid arteries are well opacified bilaterally without hemodynamically significant stenosis or other abnormality. Carotid bifurcations are normal. The internal carotid arteries are well opacified to the level of the skullbase without evidence of dissection, high-grade stenosis, or occlusion.  External carotid arteries and their branches are within normal limits.  Both vertebral arteries arise from the subclavian arteries. Vertebral arteries are equal and caliber and well opacified without evidence of dissection, he a significant stenosis, or occlusion.  No acute soft tissue  abnormality identified within the neck. No adenopathy. Thyroid gland is normal. Visualized superior mediastinum within normal limits.  Visualized lung apices are clear.  No acute osseous abnormality. No worrisome lytic or blastic osseous lesions. Gentle reversal of the normal cervical lordosis may be related to patient positioning.  IMPRESSION: Normal CTA of the neck with no acute abnormality or significant stenosis identified.   Electronically Signed   By: Jeannine Boga M.D.   On: 05/24/2014 02:46      Alvina Chou, PA-C 05/24/14 [id number]

## 2014-05-24 NOTE — ED Provider Notes (Signed)
CSN: [id number]     Arrival date & time 05/23/14  2218 History   First MD Initiated Contact with Patient 05/23/14 2245     Chief Complaint  Patient presents with  . Neck Pain  . Near Syncope     (Consider location/radiation/quality/duration/timing/severity/associated sxs/prior Treatment) HPI Donna Aguilar is a 33 y.o. female who presents to ED with complaint of feeling weak, dizzy, short of breath, with intermittent right neck pain and headaches. States symptoms began a week ago. States intermittently develops pain in right side of the neck that is sharp, radiates into the head. States associated with dizziness, shortness of breath. States no energy. States "I just dont feel myself." Denies fever or chills. No n/v/d. No chest pain or abdominal pain. No LE swelling. Recent surgery on R knee, ACL repair 3 months ago, healing well. Pt states "I am worried  I have a blood clot in my neck." denies hx of the same. No hx of clots.   Past Medical History  Diagnosis Date  . Depression   . GERD (gastroesophageal reflux disease)   . PTSD (post-traumatic stress disorder)   . Vaginal dryness     h/o  . Anxiety   . Right ACL tear    Past Surgical History  Procedure Laterality Date  . Wisdom tooth extraction    . Knee arthroscopy with anterior cruciate ligament (acl) repair Right 02/22/2014    Procedure: RIGHT ANTERIOR CRUCIATE LIGAMENT (ACL) RECONSTRUCTION WITH ALLOGRAFT/PARTIAL ANTERIOR MEDIAL MENISECTOMY;  Surgeon: Kerin Salen, MD;  Location: [facility];  Service: Orthopedics;  Laterality: Right;   Family History  Problem Relation Age of Onset  . Arthritis Other   . Hyperlipidemia Other   . Stroke Other   . Mental illness Other   . Hypertension Mother   . Heart attack Maternal Grandmother   . Hypertension Maternal Grandmother   . Diabetes Paternal Grandmother    History  Substance Use Topics  . Smoking status: Current Some Day Smoker -- 0.50 packs/day for 10 years     Types: Cigarettes    Last Attempt to Quit: 01/02/2011  . Smokeless tobacco: Never Used  . Alcohol Use: 1.2 oz/week    2 Glasses of wine per week     Comment: social   OB History   Grav Para Term Preterm Abortions TAB SAB Ect Mult Living   3 1        1      Review of Systems  Constitutional: Positive for fatigue. Negative for fever and chills.  Respiratory: Negative for cough, chest tightness and shortness of breath.   Cardiovascular: Negative for chest pain, palpitations and leg swelling.  Gastrointestinal: Negative for nausea, vomiting, abdominal pain and diarrhea.  Genitourinary: Negative for dysuria, flank pain and pelvic pain.  Musculoskeletal: Positive for neck pain. Negative for arthralgias, myalgias and neck stiffness.  Skin: Negative for rash.  Neurological: Positive for dizziness, weakness, light-headedness and headaches.  All other systems reviewed and are negative.     Allergies  Review of patient's allergies indicates no known allergies.  Home Medications   Prior to Admission medications   Medication Sig Start Date End Date Taking? Authorizing Provider  B Complex-C (B-COMPLEX WITH VITAMIN C) tablet Take 1 tablet by mouth daily.   Yes Historical Provider, MD  metroNIDAZOLE (METROGEL) 0.75 % vaginal gel Place 1 application vaginally at bedtime. For 5 days 05/12/14  Yes Historical Provider, MD  Niacin (VITAMIN B-3 PO) Take 1 tablet by mouth daily as  needed (exercise days).   Yes Historical Provider, MD  Probiotic Product (PROBIOTIC PO) Take by mouth.     Yes Historical Provider, MD  vitamin C (ASCORBIC ACID) 500 MG tablet Take 500 mg by mouth daily.   Yes Historical Provider, MD  Vitamin D, Ergocalciferol, (DRISDOL) 50000 UNITS CAPS capsule Take 1 capsule by mouth 2 (two) times a week. Emmie Niemann, Sat) 05/13/14  Yes Historical Provider, MD  levonorgestrel (MIRENA) 20 MCG/24HR IUD 1 Intra Uterine Device (1 each total) by Intrauterine route once. 10/12/07   Janith Lima, MD    BP 139/103  Pulse 94  Temp(Src) 98 F (36.7 C) (Oral)  Resp 18  SpO2 100% Physical Exam  Nursing note and vitals reviewed. Constitutional: She is oriented to person, place, and time. She appears well-developed and well-nourished. No distress.  HENT:  Head: Normocephalic and atraumatic.  Eyes: Conjunctivae and EOM are normal. Pupils are equal, round, and reactive to light.  Neck: Neck supple.  Normal right neck, no bruits, no swelling, no pulsatile masses. Tender to palpation over right trapezius  Cardiovascular: Normal rate, regular rhythm and normal heart sounds.   Pulmonary/Chest: Effort normal and breath sounds normal. No respiratory distress. She has no wheezes. She has no rales.  Abdominal: Soft. Bowel sounds are normal. She exhibits no distension. There is no tenderness. There is no rebound.  Musculoskeletal: She exhibits no edema.  Neurological: She is alert and oriented to person, place, and time.  Skin: Skin is warm and dry.  Psychiatric: She has a normal mood and affect. Her behavior is normal.    ED Course  Procedures (including critical care time) Labs Review Labs Reviewed  CBC WITH DIFFERENTIAL - Abnormal; Notable for the following:    Neutrophils Relative % 32 (*)    Lymphocytes Relative 62 (*)    All other components within normal limits  URINALYSIS, ROUTINE W REFLEX MICROSCOPIC - Abnormal; Notable for the following:    Hgb urine dipstick SMALL (*)    All other components within normal limits  URINE MICROSCOPIC-ADD ON  COMPREHENSIVE METABOLIC PANEL  I-STAT TROPOININ, ED  POC URINE PREG, ED    Imaging Review No results found.   EKG Interpretation   Date/Time:  Sunday May 23 2014 23:09:56 EDT Ventricular Rate:  85 PR Interval:  163 QRS Duration: 94 QT Interval:  386 QTC Calculation: 459 R Axis:   88 Text Interpretation:  Sinus rhythm RAE, consider biatrial enlargement When  compared with ECG of 02/28/2010, No significant change was found  Confirmed  by [facility]  MD, DAVID ([id number]) on 05/23/2014 11:13:28 PM      MDM   Final diagnoses:  Neck pain on right side    Pt with right neck pain, dizziness, headache, weakness. Will get labs, UA, ct neck to r/o dissection. No CP, not hypoxic, not tachycardic, doubt PE.   1:32 AM unremarkable CBC. CMP had to be redrawn, pending. Waiting on CT angio neck. Pt signed out at shift change   Filed Vitals:   05/23/14 2223 05/23/14 2324  BP: 133/86 139/103  Pulse: 99 94  Temp: 98.4 F (36.9 C) 98 F (36.7 C)  TempSrc: Oral Oral  Resp: 20 18  SpO2: 100% 100%     Renold Genta, PA-C 05/25/14 [id number]

## 2014-05-26 NOTE — ED Provider Notes (Signed)
Medical screening examination/treatment/procedure(s) were performed by non-physician practitioner and as supervising physician I was immediately available for consultation/collaboration.   EKG Interpretation   Date/Time:  Sunday May 23 2014 23:09:56 EDT Ventricular Rate:  85 PR Interval:  163 QRS Duration: 94 QT Interval:  386 QTC Calculation: 459 R Axis:   88 Text Interpretation:  Sinus rhythm RAE, consider biatrial enlargement When  compared with ECG of 02/28/2010, No significant change was found Confirmed  by [facility]  MD, DAVID ([id number]) on 05/23/2014 11:13:28 PM      Rolland Porter, MD, Alanson Aly, MD 05/26/14 1312

## 2014-05-31 ENCOUNTER — Encounter (HOSPITAL_COMMUNITY): Payer: Self-pay

## 2014-07-27 ENCOUNTER — Other Ambulatory Visit: Payer: Self-pay | Admitting: Internal Medicine

## 2014-10-25 ENCOUNTER — Other Ambulatory Visit: Payer: Self-pay | Admitting: Internal Medicine

## 2014-10-25 ENCOUNTER — Encounter: Payer: Self-pay | Admitting: Internal Medicine

## 2014-10-25 ENCOUNTER — Other Ambulatory Visit (INDEPENDENT_AMBULATORY_CARE_PROVIDER_SITE_OTHER): Payer: BC Managed Care – PPO

## 2014-10-25 ENCOUNTER — Ambulatory Visit (INDEPENDENT_AMBULATORY_CARE_PROVIDER_SITE_OTHER): Payer: BC Managed Care – PPO | Admitting: Internal Medicine

## 2014-10-25 VITALS — BP 112/78 | HR 102 | Temp 98.5°F | Resp 16 | Ht 65.0 in | Wt 189.0 lb

## 2014-10-25 DIAGNOSIS — G729 Myopathy, unspecified: Secondary | ICD-10-CM

## 2014-10-25 DIAGNOSIS — G63 Polyneuropathy in diseases classified elsewhere: Principal | ICD-10-CM

## 2014-10-25 DIAGNOSIS — Z0000 Encounter for general adult medical examination without abnormal findings: Secondary | ICD-10-CM

## 2014-10-25 DIAGNOSIS — R270 Ataxia, unspecified: Secondary | ICD-10-CM | POA: Diagnosis not present

## 2014-10-25 DIAGNOSIS — R208 Other disturbances of skin sensation: Secondary | ICD-10-CM

## 2014-10-25 DIAGNOSIS — R200 Anesthesia of skin: Secondary | ICD-10-CM | POA: Insufficient documentation

## 2014-10-25 DIAGNOSIS — E538 Deficiency of other specified B group vitamins: Secondary | ICD-10-CM

## 2014-10-25 LAB — CBC WITH DIFFERENTIAL/PLATELET
Basophils Absolute: 0 10*3/uL (ref 0.0–0.1)
Basophils Relative: 0.6 % (ref 0.0–3.0)
Eosinophils Absolute: 0 10*3/uL (ref 0.0–0.7)
Eosinophils Relative: 0.7 % (ref 0.0–5.0)
HCT: 42 % (ref 36.0–46.0)
Hemoglobin: 14.1 g/dL (ref 12.0–15.0)
Lymphs Abs: 2.8 10*3/uL (ref 0.7–4.0)
MCHC: 33.7 g/dL (ref 30.0–36.0)
MCV: 88.4 fl (ref 78.0–100.0)
MONOS PCT: 4.9 % (ref 3.0–12.0)
Monocytes Absolute: 0.2 10*3/uL (ref 0.1–1.0)
Neutro Abs: 1.6 10*3/uL (ref 1.4–7.7)
Neutrophils Relative %: 34.4 % — ABNORMAL LOW (ref 43.0–77.0)
PLATELETS: 209 10*3/uL (ref 150.0–400.0)
RBC: 4.75 Mil/uL (ref 3.87–5.11)
RDW: 14.4 % (ref 11.5–15.5)
WBC: 4.8 10*3/uL (ref 4.0–10.5)

## 2014-10-25 LAB — COMPREHENSIVE METABOLIC PANEL
ALK PHOS: 51 U/L (ref 39–117)
ALT: 10 U/L (ref 0–35)
AST: 16 U/L (ref 0–37)
Albumin: 4.4 g/dL (ref 3.5–5.2)
BILIRUBIN TOTAL: 0.9 mg/dL (ref 0.2–1.2)
BUN: 9 mg/dL (ref 6–23)
CHLORIDE: 103 meq/L (ref 96–112)
CO2: 25 meq/L (ref 19–32)
Calcium: 9.5 mg/dL (ref 8.4–10.5)
Creatinine, Ser: 0.89 mg/dL (ref 0.40–1.20)
GFR: 92.42 mL/min (ref >60.00)
Glucose, Bld: 84 mg/dL (ref 70–99)
Potassium: 4.1 mEq/L (ref 3.5–5.1)
Sodium: 135 mEq/L (ref 135–145)
Total Protein: 7.7 g/dL (ref 6.0–8.3)

## 2014-10-25 LAB — CK: Total CK: 127 U/L (ref 7–177)

## 2014-10-25 LAB — LIPID PANEL
Cholesterol: 210 mg/dL — ABNORMAL HIGH (ref 0–200)
HDL: 84 mg/dL (ref >39.00)
LDL CALC: 117 mg/dL — AB (ref 0–99)
NONHDL: 126
Total CHOL/HDL Ratio: 3
Triglycerides: 43 mg/dL (ref 0.0–149.0)
VLDL: 8.6 mg/dL (ref 0.0–40.0)

## 2014-10-25 LAB — VITAMIN B12: Vitamin B-12: 189 pg/mL — ABNORMAL LOW (ref 211–911)

## 2014-10-25 LAB — TSH: TSH: 1.25 u[IU]/mL (ref 0.35–4.50)

## 2014-10-25 LAB — FOLATE: FOLATE: 7.4 ng/mL (ref >5.9)

## 2014-10-25 MED ORDER — CYANOCOBALAMIN 500 MCG/0.1ML NA SOLN: 0.1000 mL | NASAL | Status: DC

## 2014-10-25 NOTE — Assessment & Plan Note (Signed)
She refused a flu vax PAP is UTD Exam done Labs ordered Pt ed material was given

## 2014-10-25 NOTE — Patient Instructions (Signed)

## 2014-10-25 NOTE — Progress Notes (Signed)
Subjective:    Patient ID: Donna Aguilar, female    DOB: [birth year], 34 y.o.   MRN: [id number]  HPI Comments: She comes in today for a physical but she also complains of worsening neurological s/s for about 4-5 months with headaches, ataxia, "spasms and twitching" in the muscles of her arms and legs, and numbness in her fingers. She was seen in the ER about 5 months ago with neck pain and spasms and a CT-angio of her neck was normal.     Review of Systems  Constitutional: Negative.  Negative for fever, chills, diaphoresis, appetite change and fatigue.  HENT: Negative for drooling and trouble swallowing.   Eyes: Negative.   Respiratory: Negative.  Negative for cough, choking, chest tightness, shortness of breath and stridor.   Cardiovascular: Negative.  Negative for chest pain, palpitations and leg swelling.  Gastrointestinal: Negative.  Negative for nausea, vomiting, abdominal pain, diarrhea, constipation and blood in stool.  Endocrine: Negative.   Genitourinary: Negative.   Musculoskeletal: Positive for myalgias. Negative for back pain, joint swelling, arthralgias, gait problem, neck pain and neck stiffness.  Skin: Negative.   Allergic/Immunologic: Negative.   Neurological: Positive for numbness and headaches. Negative for dizziness, tremors, seizures, syncope, facial asymmetry, speech difficulty, weakness and light-headedness.  Hematological: Negative.  Negative for adenopathy. Does not bruise/bleed easily.  Psychiatric/Behavioral: Negative.        Objective:   Physical Exam  Constitutional: She is oriented to person, place, and time. She appears well-developed and well-nourished. No distress.  HENT:  Head: Normocephalic and atraumatic.  Mouth/Throat: Oropharynx is clear and moist. No oropharyngeal exudate.  Eyes: Conjunctivae and EOM are normal. Pupils are equal, round, and reactive to light. Right eye exhibits no discharge. Left eye exhibits no discharge. No scleral icterus.    Neck: Normal range of motion. Neck supple. No JVD present. No tracheal deviation present. No thyromegaly present.  Cardiovascular: Normal rate, regular rhythm, normal heart sounds and intact distal pulses.  Exam reveals no gallop and no friction rub.   No murmur heard. Pulmonary/Chest: Effort normal and breath sounds normal. No stridor. No respiratory distress. She has no wheezes. She has no rales. She exhibits no tenderness.  Abdominal: Soft. Bowel sounds are normal. She exhibits no distension and no mass. There is no tenderness. There is no rebound and no guarding.  Musculoskeletal: Normal range of motion. She exhibits no edema or tenderness.  Lymphadenopathy:    She has no cervical adenopathy.  Neurological: She is alert and oriented to person, place, and time. She has normal strength. She displays no atrophy, no tremor and normal reflexes. No cranial nerve deficit or sensory deficit. She exhibits normal muscle tone. She displays a negative Romberg sign. She displays no seizure activity. Coordination and gait normal. She displays no Babinski's sign on the right side. She displays Babinski's sign on the left side.  Reflex Scores:      Tricep reflexes are 1+ on the right side and 1+ on the left side.      Bicep reflexes are 1+ on the right side and 1+ on the left side.      Brachioradialis reflexes are 1+ on the right side and 1+ on the left side.      Patellar reflexes are 1+ on the right side and 1+ on the left side.      Achilles reflexes are 1+ on the right side and 1+ on the left side. Skin: Skin is warm and dry. No rash noted. She  is not diaphoretic. No erythema. No pallor.  Psychiatric: She has a normal mood and affect. Her behavior is normal. Judgment and thought content normal.  Vitals reviewed.    Lab Results  Component Value Date   WBC 5.9 05/23/2014   HGB 14.1 05/23/2014   HCT 43.0 05/23/2014   PLT 201 05/23/2014   GLUCOSE 92 05/24/2014   CHOL 213* 05/29/2011   TRIG 58.0  05/29/2011   HDL 75.60 05/29/2011   LDLDIRECT 147.5 05/29/2011   ALT 10 05/24/2014   AST 18 05/24/2014   NA 136* 05/24/2014   K 4.5 05/24/2014   CL 101 05/24/2014   CREATININE 0.86 05/24/2014   BUN 9 05/24/2014   CO2 24 05/24/2014   TSH 1.07 07/29/2013       Assessment & Plan:

## 2014-10-25 NOTE — Assessment & Plan Note (Signed)
Will check a CPK level to see if there is a myositis Will check other labs to screen for metabolic causes of myopathy She also have numbness so I may order a NCS/EMG to see if there is a neuropathy

## 2014-10-25 NOTE — Assessment & Plan Note (Signed)
Will check her labs to screen for B12 defic and metabolic causes of numbness May order a NCS/EMG pending the results of labs and the MRI of her brain

## 2014-10-25 NOTE — Progress Notes (Signed)
Pre visit review using our clinic review tool, if applicable. No additional management support is needed unless otherwise documented below in the visit note. 

## 2014-10-25 NOTE — Assessment & Plan Note (Signed)
Her exam is normal but she does have a myriad of neuro complaints Will get an MRI doe to see if there is hydrocephalus, demyelination, mass, cva

## 2014-10-26 ENCOUNTER — Encounter: Payer: Self-pay | Admitting: Internal Medicine

## 2014-11-26 ENCOUNTER — Telehealth: Payer: Self-pay | Admitting: Internal Medicine

## 2014-11-26 DIAGNOSIS — G63 Polyneuropathy in diseases classified elsewhere: Principal | ICD-10-CM

## 2014-11-26 DIAGNOSIS — E538 Deficiency of other specified B group vitamins: Secondary | ICD-10-CM

## 2014-11-26 MED ORDER — CYANOCOBALAMIN 500 MCG/0.1ML NA SOLN: 0.1000 mL | NASAL | Status: DC

## 2014-11-26 NOTE — Telephone Encounter (Signed)
Please send Cyanocobalamin 500 MCG/0.1ML SOLN [[phone] [facility]. In [place]

## 2014-11-26 NOTE — Telephone Encounter (Signed)
error 

## 2014-11-26 NOTE — Telephone Encounter (Signed)
[facility].../lmb

## 2014-11-29 ENCOUNTER — Ambulatory Visit (INDEPENDENT_AMBULATORY_CARE_PROVIDER_SITE_OTHER): Payer: BC Managed Care – PPO | Admitting: *Deleted

## 2014-11-29 ENCOUNTER — Telehealth: Payer: Self-pay | Admitting: *Deleted

## 2014-11-29 DIAGNOSIS — E538 Deficiency of other specified B group vitamins: Secondary | ICD-10-CM | POA: Diagnosis not present

## 2014-11-29 MED ORDER — CYANOCOBALAMIN 1000 MCG/ML IJ SOLN
1000.0000 ug | Freq: Once | INTRAMUSCULAR | Status: AC
  Administered 2014-11-29: 1000 ug via INTRAMUSCULAR

## 2014-11-29 NOTE — Telephone Encounter (Signed)
Notified pt with md response. Will call bck once she check her work schedule to get next injection set-up...Donna Aguilar

## 2014-11-29 NOTE — Telephone Encounter (Signed)
If she had had three injections of B12 then she can start the NS - she may want a co-pay coupon

## 2014-11-29 NOTE — Telephone Encounter (Signed)
Pt came in to get her first B12 injection. She stated that md sent rx for the nasal B12 which her pharmacy has to order. She is wanting to know when she need to start taking the nasal b12...Donna Aguilar

## 2014-12-02 ENCOUNTER — Telehealth: Payer: Self-pay | Admitting: Internal Medicine

## 2014-12-02 MED ORDER — "SYRINGE/NEEDLE (DISP) 25G X 5/8"" 1 ML MISC": Status: DC

## 2014-12-02 MED ORDER — CYANOCOBALAMIN 1000 MCG/ML IJ SOLN: 1000.0000 ug | Freq: Once | INTRAMUSCULAR | Status: DC

## 2014-12-02 NOTE — Telephone Encounter (Signed)
Pt called in said that she def wants the b12 inj but she is contracted to work in [place] and will be impossible to come in to get the rest of the the inj.  She wanted to know what is her options.  Can she get them and inj herself??     Best number [phone]

## 2014-12-02 NOTE — Telephone Encounter (Signed)
Yes, I have a co-pay card

## 2014-12-02 NOTE — Telephone Encounter (Signed)
Called pt inform her we can send rx to her pharmacy, and she can give them to herself then after 3 injections she will start the nasal b12. Dr. Ronnald Ramp do you have coupons for the nasal b12 she want them mail to her address since she is not able to pick up...Donna Aguilar

## 2014-12-02 NOTE — Telephone Encounter (Signed)
Mailed to address...Donna Aguilar

## 2015-08-22 LAB — BASIC METABOLIC PANEL
BUN: 10 mg/dL (ref 4–21)
Creatinine: 0.8 mg/dL (ref 0.5–1.1)
GLUCOSE: 78 mg/dL
Potassium: 4.2 mmol/L (ref 3.4–5.3)
Sodium: 137 mmol/L (ref 137–147)

## 2015-08-22 LAB — HEPATIC FUNCTION PANEL
ALK PHOS: 28 U/L (ref 25–125)
ALT: 9 U/L (ref 7–35)
AST: 12 U/L — AB (ref 13–35)

## 2015-08-22 LAB — CBC AND DIFFERENTIAL
HEMATOCRIT: 39 % (ref 36–46)
Hemoglobin: 13.1 g/dL (ref 12.0–16.0)
PLATELETS: 199 10*3/uL (ref 150–399)
WBC: 4 10*3/mL

## 2015-08-29 ENCOUNTER — Other Ambulatory Visit: Payer: Self-pay | Admitting: Internal Medicine

## 2015-09-05 ENCOUNTER — Ambulatory Visit (INDEPENDENT_AMBULATORY_CARE_PROVIDER_SITE_OTHER): Payer: BC Managed Care – PPO | Admitting: Internal Medicine

## 2015-09-05 ENCOUNTER — Encounter: Payer: Self-pay | Admitting: Internal Medicine

## 2015-09-05 VITALS — BP 140/88 | HR 86 | Temp 98.6°F | Resp 16 | Ht 65.0 in | Wt 184.0 lb

## 2015-09-05 DIAGNOSIS — K2100 Gastro-esophageal reflux disease with esophagitis, without bleeding: Secondary | ICD-10-CM

## 2015-09-05 DIAGNOSIS — R1011 Right upper quadrant pain: Secondary | ICD-10-CM | POA: Diagnosis not present

## 2015-09-05 MED ORDER — DEXLANSOPRAZOLE 60 MG PO CPDR: 60.0000 mg | DELAYED_RELEASE_CAPSULE | Freq: Every day | ORAL | Status: DC

## 2015-09-05 NOTE — Progress Notes (Signed)
Pre visit review using our clinic review tool, if applicable. No additional management support is needed unless otherwise documented below in the visit note. 

## 2015-09-05 NOTE — Patient Instructions (Signed)

## 2015-09-07 NOTE — Progress Notes (Signed)
Subjective:  Patient ID: Donna Aguilar, female    DOB: 03-22-1981  Age: 35 y.o. MRN: [id number]  CC: Abdominal Pain and Gastroesophageal Reflux   HPI [name] presents for a 2 week history of epigastric abdominal pain. She saw her gynecologist and had some lab work done and she came to see me because she was concerned about a slightly low alkaline phosphatase. I reassured her that a low alkaline phosphatase is of no concern. She complains of an intermittent aching and spasm sensation in her epigastrium and RUQ with a few episodes of nausea. The discomfort is not related to eating. She also has mild heartburn and constipation. The CBC and CMP done by her gynecologist were normal a few weeks ago.  Outpatient Prescriptions Prior to Visit  Medication Sig Dispense Refill  . B Complex-C (B-COMPLEX WITH VITAMIN C) tablet Take 1 tablet by mouth daily.    . cyanocobalamin (,VITAMIN B-12,) 1000 MCG/ML injection Inject 1 mL (1,000 mcg total) into the muscle once. Take 1 injection for the next 3 weeks 3 mL 0  . Cyanocobalamin 500 MCG/0.1ML SOLN Place 0.1 mLs (500 mcg total) into the nose once a week. 1 Bottle 11  . norelgestromin-ethinyl estradiol Marilu Favre) 150-35 MCG/24HR transdermal patch Place 1 patch onto the skin once a week. 3 patch 12  . Probiotic Product (PROBIOTIC PO) Take by mouth.      . SYRINGE/NEEDLE, DISP, 1 ML 25G X 5/8" 1 ML MISC Use to inject b12 once a week for the next 3 weeks 3 each 0  . vitamin C (ASCORBIC ACID) 500 MG tablet Take 500 mg by mouth daily.    . Vitamin D, Ergocalciferol, (DRISDOL) 50000 UNITS CAPS capsule Take 1 capsule by mouth 2 (two) times a week. (Wed, Sat)    . traMADol (ULTRAM) 50 MG tablet Take 1 tablet (50 mg total) by mouth every 6 (six) hours as needed. 15 tablet 0   No facility-administered medications prior to visit.    ROS Review of Systems  Constitutional: Negative.  Negative for fever, chills, diaphoresis, appetite change and fatigue.  HENT:  Negative.  Negative for trouble swallowing.   Eyes: Negative.   Respiratory: Negative.  Negative for cough, choking, chest tightness, shortness of breath and stridor.   Cardiovascular: Negative.  Negative for chest pain, palpitations and leg swelling.  Gastrointestinal: Positive for nausea, abdominal pain and constipation. Negative for vomiting, diarrhea, blood in stool, abdominal distention, anal bleeding and rectal pain.  Endocrine: Negative.   Genitourinary: Negative.  Negative for dysuria, urgency, frequency, hematuria and decreased urine volume.  Musculoskeletal: Negative.  Negative for back pain, arthralgias and neck pain.  Skin: Negative.  Negative for color change, pallor and rash.  Allergic/Immunologic: Negative.   Neurological: Negative.  Negative for dizziness.  Hematological: Negative.  Negative for adenopathy. Does not bruise/bleed easily.  Psychiatric/Behavioral: Negative.     Objective:  BP 140/88 mmHg  Pulse 86  Temp(Src) 98.6 F (37 C) (Oral)  Resp 16  Ht 5\' 5"  (1.651 m)  Wt 184 lb (83.462 kg)  BMI 30.62 kg/m2  SpO2 95%  LMP 09/02/2015  BP Readings from Last 3 Encounters:  09/05/15 140/88  10/25/14 112/78  05/24/14 136/80    Wt Readings from Last 3 Encounters:  09/05/15 184 lb (83.462 kg)  10/25/14 189 lb (85.73 kg)  02/22/14 197 lb (89.359 kg)    Physical Exam  Constitutional: She is oriented to person, place, and time. She appears well-developed and well-nourished. No distress.  HENT:  Mouth/Throat: Oropharynx is clear and moist. No oropharyngeal exudate.  Eyes: Conjunctivae are normal. Right eye exhibits no discharge. Left eye exhibits no discharge. No scleral icterus.  Neck: Normal range of motion. Neck supple. No JVD present. No tracheal deviation present. No thyromegaly present.  Cardiovascular: Normal rate, regular rhythm, normal heart sounds and intact distal pulses.  Exam reveals no gallop and no friction rub.   No murmur  heard. Pulmonary/Chest: Effort normal and breath sounds normal. No stridor. No respiratory distress. She has no wheezes. She has no rales. She exhibits no tenderness.  Abdominal: Soft. Normal appearance and bowel sounds are normal. She exhibits no distension and no mass. There is no hepatosplenomegaly, splenomegaly or hepatomegaly. There is no tenderness. There is no rebound, no guarding and no CVA tenderness. No hernia. Hernia confirmed negative in the ventral area.  Musculoskeletal: Normal range of motion. She exhibits no edema or tenderness.  Lymphadenopathy:    She has no cervical adenopathy.  Neurological: She is oriented to person, place, and time.  Skin: Skin is warm and dry. No rash noted. She is not diaphoretic. No erythema. No pallor.  Psychiatric: She has a normal mood and affect. Her behavior is normal. Judgment and thought content normal.  Vitals reviewed.   Lab Results  Component Value Date   WBC 4.8 10/25/2014   HGB 14.1 10/25/2014   HCT 42.0 10/25/2014   PLT 209.0 10/25/2014   GLUCOSE 84 10/25/2014   CHOL 210* 10/25/2014   TRIG 43.0 10/25/2014   HDL 84.00 10/25/2014   LDLDIRECT 147.5 05/29/2011   LDLCALC 117* 10/25/2014   ALT 10 10/25/2014   AST 16 10/25/2014   NA 135 10/25/2014   K 4.1 10/25/2014   CL 103 10/25/2014   CREATININE 0.89 10/25/2014   BUN 9 10/25/2014   CO2 25 10/25/2014   TSH 1.25 10/25/2014    Ct Angio Neck W/cm &/or Wo/cm  05/24/2014  CLINICAL DATA:  Initial evaluation for right neck pain and swelling. EXAM: CT ANGIOGRAPHY NECK TECHNIQUE: Multidetector CT imaging of the neck was performed using the standard protocol during bolus administration of intravenous contrast. Multiplanar CT image reconstructions and MIPs were obtained to evaluate the vascular anatomy. Carotid stenosis measurements (when applicable) are obtained utilizing NASCET criteria, using the distal internal carotid diameter as the denominator. CONTRAST:  170mL OMNIPAQUE IOHEXOL 350  MG/ML SOLN COMPARISON:  None available. FINDINGS: The visualized aortic arch is of normal caliber in appearance with normal 3 vessel morphology. No high-grade stenosis seen at the origin of the great vessels. Subclavian arteries within normal limits. The common carotid arteries are well opacified bilaterally without hemodynamically significant stenosis or other abnormality. Carotid bifurcations are normal. The internal carotid arteries are well opacified to the level of the skullbase without evidence of dissection, high-grade stenosis, or occlusion. External carotid arteries and their branches are within normal limits. Both vertebral arteries arise from the subclavian arteries. Vertebral arteries are equal and caliber and well opacified without evidence of dissection, he a significant stenosis, or occlusion. No acute soft tissue abnormality identified within the neck. No adenopathy. Thyroid gland is normal. Visualized superior mediastinum within normal limits. Visualized lung apices are clear. No acute osseous abnormality. No worrisome lytic or blastic osseous lesions. Gentle reversal of the normal cervical lordosis may be related to patient positioning. IMPRESSION: Normal CTA of the neck with no acute abnormality or significant stenosis identified. Electronically Signed   By: Jeannine Boga M.D.   On: 05/24/2014 02:46  Assessment & Plan:   Keiasia was seen today for abdominal pain and gastroesophageal reflux.  Diagnoses and all orders for this visit:  Gastroesophageal reflux disease with esophagitis- her symptoms may be entirely related to gastroesophageal reflux disease so I have asked her to start taking a proton pump inhibitor. -     dexlansoprazole (DEXILANT) 60 MG capsule; Take 1 capsule (60 mg total) by mouth daily. -     Cancel: CBC with Differential/Platelet; Future  Abdominal pain, right upper quadrant- her recent labs were within normal limits, I have asked her to undergo an  ultrasound to screen for cholelithiasis and other gallbladder abnormalities. -     Cancel: Lipase; Future -     Cancel: Hepatic function panel; Future -     Cancel: CBC with Differential/Platelet; Future -     Cancel: Magnesium; Future -     Cancel: Phosphorus; Future -     US Abdomen Complete; Future   I have discontinued Ms. Nesheim's traMADol. I am also having her start on dexlansoprazole. Additionally, I am having her maintain her Probiotic Product (PROBIOTIC PO), Vitamin D (Ergocalciferol), B-complex with vitamin C, vitamin C, Cyanocobalamin, cyanocobalamin, SYRINGE/NEEDLE (DISP) 1 ML, and norelgestromin-ethinyl estradiol.  Meds ordered this encounter  Medications  . dexlansoprazole (DEXILANT) 60 MG capsule    Sig: Take 1 capsule (60 mg total) by mouth daily.    Dispense:  30 capsule    Refill:  11     Follow-up: Return in about 4 weeks (around 10/03/2015).  Scarlette Calico, MD

## 2015-09-19 IMAGING — CT CT ANGIO NECK
2 of 3 series · 8 of 14 positions shown · IV contrast ([ID] [id number])
Comparison: None available.

CLINICAL DATA: Initial evaluation for right neck pain and swelling.

EXAM:
CT ANGIOGRAPHY NECK
TECHNIQUE: Multidetector CT imaging of the neck was performed using the
standard protocol during bolus administration of intravenous
contrast. Multiplanar CT image reconstructions and MIPs were
obtained to evaluate the vascular anatomy. Carotid stenosis
measurements (when applicable) are obtained utilizing NASCET
criteria, using the distal internal carotid diameter as the
denominator.
CONTRAST:  100mL OMNIPAQUE IOHEXOL 350 MG/ML SOLN

[Series 6: cta neck · axial · 0.39mm/px · z∈[+1024,+1094]mm · 2 of 106 slices shown]
[im 36/106  soft-tissue]
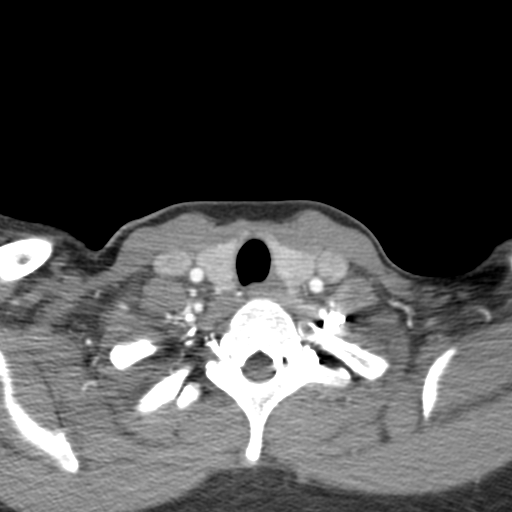
[im 71/106  soft-tissue]
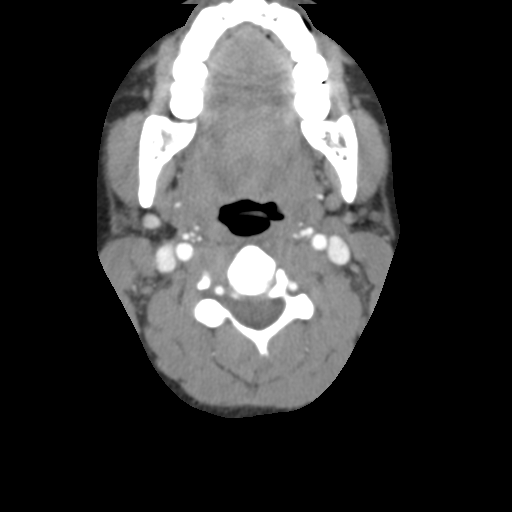

[Series 7: axial · axial · 0.39mm/px · z∈[+984,+1134]mm · 6 of 210 slices shown]
[im 30/210  soft-tissue]
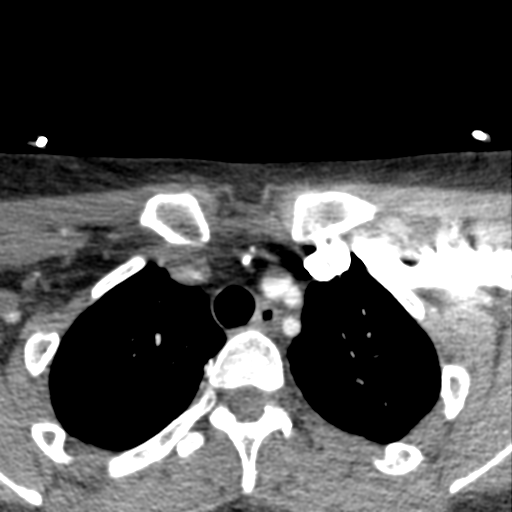
[im 60/210  bone]
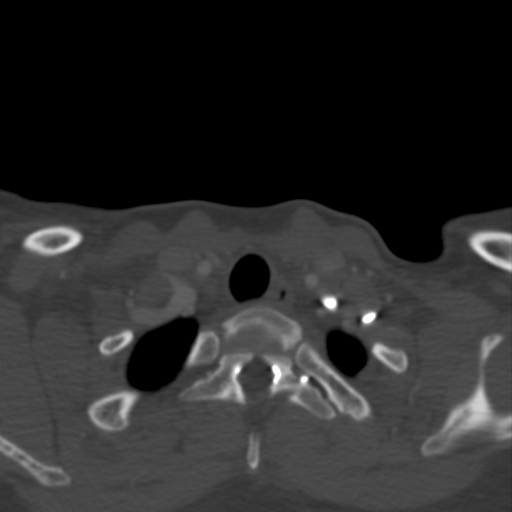
[im 90/210  soft-tissue]
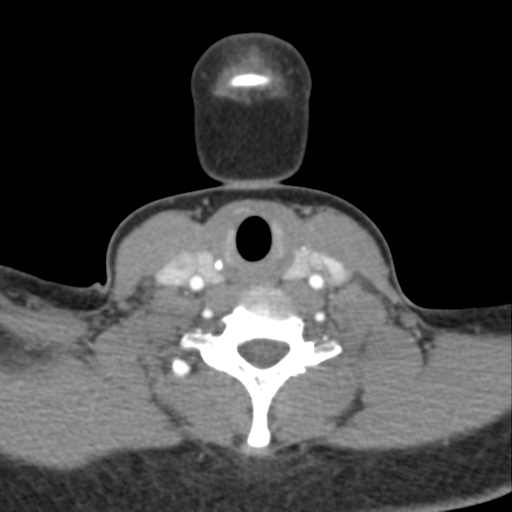
[im 120/210  bone]
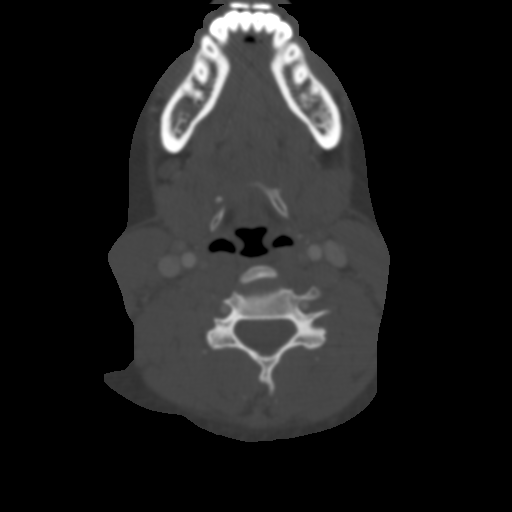
[im 150/210  soft-tissue]
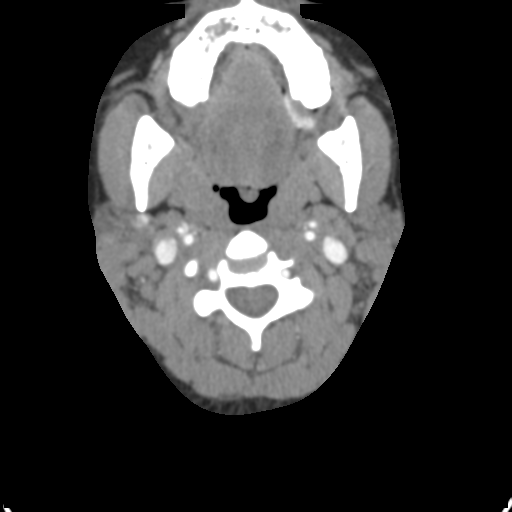
[im 180/210  bone]
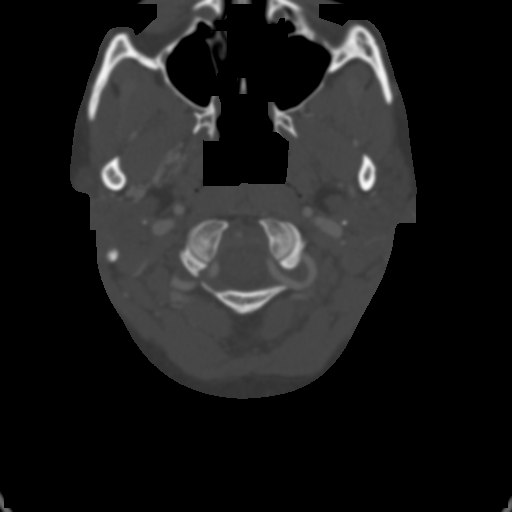

[8 of 14 positions shown; findings below may reference images not displayed]

FINDINGS: The visualized aortic arch is of normal caliber in appearance with
normal 3 vessel morphology. No high-grade stenosis seen at the
origin of the great vessels. Subclavian arteries within normal
limits.

The common carotid arteries are well opacified bilaterally without
hemodynamically significant stenosis or other abnormality. Carotid
bifurcations are normal. The internal carotid arteries are well
opacified to the level of the skullbase without evidence of
dissection, high-grade stenosis, or occlusion.

External carotid arteries and their branches are within normal
limits.

Both vertebral arteries arise from the subclavian arteries.
Vertebral arteries are equal and caliber and well opacified without
evidence of dissection, he a significant stenosis, or occlusion.

No acute soft tissue abnormality identified within the neck. No
adenopathy. Thyroid gland is normal. Visualized superior mediastinum
within normal limits.

Visualized lung apices are clear.

No acute osseous abnormality. No worrisome lytic or blastic osseous
lesions. Gentle reversal of the normal cervical lordosis may be
related to patient positioning.
IMPRESSION: Normal CTA of the neck with no acute abnormality or significant
stenosis identified.

## 2015-09-22 ENCOUNTER — Ambulatory Visit
Admission: RE | Admit: 2015-09-22 | Discharge: 2015-09-22 | Disposition: A | Discharge status: Home / Self Care | Payer: BC Managed Care – PPO | Source: Ambulatory Visit | Attending: Internal Medicine | Admitting: Internal Medicine

## 2015-09-22 DIAGNOSIS — R1011 Right upper quadrant pain: Secondary | ICD-10-CM

## 2015-09-30 ENCOUNTER — Other Ambulatory Visit: Payer: Self-pay | Admitting: Internal Medicine

## 2016-12-03 ENCOUNTER — Ambulatory Visit (INDEPENDENT_AMBULATORY_CARE_PROVIDER_SITE_OTHER): Payer: BC Managed Care – PPO | Admitting: Internal Medicine

## 2016-12-03 ENCOUNTER — Other Ambulatory Visit (INDEPENDENT_AMBULATORY_CARE_PROVIDER_SITE_OTHER): Payer: BC Managed Care – PPO

## 2016-12-03 ENCOUNTER — Encounter: Payer: Self-pay | Admitting: Internal Medicine

## 2016-12-03 VITALS — BP 134/80 | HR 72 | Temp 98.9°F | Resp 16 | Ht 65.0 in | Wt 184.8 lb

## 2016-12-03 DIAGNOSIS — K2100 Gastro-esophageal reflux disease with esophagitis, without bleeding: Secondary | ICD-10-CM

## 2016-12-03 DIAGNOSIS — R1011 Right upper quadrant pain: Secondary | ICD-10-CM

## 2016-12-03 DIAGNOSIS — R888 Abnormal findings in other body fluids and substances: Secondary | ICD-10-CM | POA: Diagnosis not present

## 2016-12-03 DIAGNOSIS — D7289 Other specified disorders of white blood cells: Secondary | ICD-10-CM | POA: Insufficient documentation

## 2016-12-03 LAB — COMPREHENSIVE METABOLIC PANEL
ALBUMIN: 4.4 g/dL (ref 3.5–5.2)
ALK PHOS: 42 U/L (ref 39–117)
ALT: 13 U/L (ref 0–35)
AST: 17 U/L (ref 0–37)
BILIRUBIN TOTAL: 0.7 mg/dL (ref 0.2–1.2)
BUN: 11 mg/dL (ref 6–23)
CO2: 25 mEq/L (ref 19–32)
Calcium: 9.3 mg/dL (ref 8.4–10.5)
Chloride: 104 mEq/L (ref 96–112)
Creatinine, Ser: 0.87 mg/dL (ref 0.40–1.20)
GFR: 93.78 mL/min (ref >60.00)
GLUCOSE: 95 mg/dL (ref 70–99)
POTASSIUM: 4 meq/L (ref 3.5–5.1)
Sodium: 136 mEq/L (ref 135–145)
TOTAL PROTEIN: 7.3 g/dL (ref 6.0–8.3)

## 2016-12-03 LAB — CBC WITH DIFFERENTIAL/PLATELET
BASOS ABS: 0 10*3/uL (ref 0.0–0.1)
Basophils Relative: 1 % (ref 0.0–3.0)
Eosinophils Absolute: 0 10*3/uL (ref 0.0–0.7)
Eosinophils Relative: 0.7 % (ref 0.0–5.0)
HEMATOCRIT: 40.4 % (ref 36.0–46.0)
Hemoglobin: 13.2 g/dL (ref 12.0–15.0)
LYMPHS ABS: 2.8 10*3/uL (ref 0.7–4.0)
Lymphocytes Relative: 62.3 % — ABNORMAL HIGH (ref 12.0–46.0)
MCHC: 32.7 g/dL (ref 30.0–36.0)
MCV: 89.9 fl (ref 78.0–100.0)
MONO ABS: 0.3 10*3/uL (ref 0.1–1.0)
MONOS PCT: 6.9 % (ref 3.0–12.0)
NEUTROS PCT: 29.1 % — AB (ref 43.0–77.0)
Neutro Abs: 1.3 10*3/uL — ABNORMAL LOW (ref 1.4–7.7)
PLATELETS: 213 10*3/uL (ref 150.0–400.0)
RBC: 4.5 Mil/uL (ref 3.87–5.11)
RDW: 14.1 % (ref 11.5–15.5)
WBC: 4.4 10*3/uL (ref 4.0–10.5)

## 2016-12-03 LAB — LIPASE: LIPASE: 12 U/L (ref 11.0–59.0)

## 2016-12-03 LAB — HCG, QUANTITATIVE, PREGNANCY: QUANTITATIVE HCG: 0.23 m[IU]/mL

## 2016-12-03 LAB — AMYLASE: Amylase: 54 U/L (ref 27–131)

## 2016-12-03 MED ORDER — OMEPRAZOLE 40 MG PO CPDR: 40.0000 mg | DELAYED_RELEASE_CAPSULE | Freq: Every day | ORAL | 1 refills | Status: DC

## 2016-12-03 MED ORDER — DICYCLOMINE HCL 10 MG PO CAPS: 10.0000 mg | ORAL_CAPSULE | Freq: Three times a day (TID) | ORAL | 1 refills | Status: DC

## 2016-12-03 NOTE — Progress Notes (Signed)
Pre visit review using our clinic review tool, if applicable. No additional management support is needed unless otherwise documented below in the visit note. 

## 2016-12-03 NOTE — Patient Instructions (Signed)

## 2016-12-03 NOTE — Progress Notes (Signed)
Subjective:  Patient ID: Thea Alken, female    DOB: 1981/03/12  Age: 36 y.o. MRN: [id number]  CC: Abdominal Pain and Gastroesophageal Reflux   HPI Brandon Scarbrough presents for recurrent episodes of abdominal pain. She was last seen about a year ago for right upper quadrant pain and had a normal ultrasound of her gallbladder. She did well for quite a while but now for the last few weeks she is having recurrent episodes of heartburn, epigastric and right upper quadrant pain that she describes as spasms that are worsened by eating fatty meals. She has had a few episodes of nausea but she denies vomiting, loss of appetite, or weight loss. She is not taking anything for the heartburn or the abdominal symptoms.  Outpatient Medications Prior to Visit  Medication Sig Dispense Refill  . B Complex-C (B-COMPLEX WITH VITAMIN C) tablet Take 1 tablet by mouth daily.    . Probiotic Product (PROBIOTIC PO) Take by mouth.      . vitamin C (ASCORBIC ACID) 500 MG tablet Take 500 mg by mouth daily.    . Vitamin D, Ergocalciferol, (DRISDOL) 50000 UNITS CAPS capsule Take 1 capsule by mouth 2 (two) times a week. (Wed, Sat)    . cyanocobalamin (,VITAMIN B-12,) 1000 MCG/ML injection Inject 1 mL (1,000 mcg total) into the muscle once. Take 1 injection for the next 3 weeks 3 mL 0  . Cyanocobalamin 500 MCG/0.1ML SOLN Place 0.1 mLs (500 mcg total) into the nose once a week. 1 Bottle 11  . dexlansoprazole (DEXILANT) 60 MG capsule Take 1 capsule (60 mg total) by mouth daily. 30 capsule 11  . norelgestromin-ethinyl estradiol Marilu Favre) 150-35 MCG/24HR transdermal patch Place 1 patch onto the skin once a week. 3 patch 12  . SYRINGE/NEEDLE, DISP, 1 ML 25G X 5/8" 1 ML MISC Use to inject b12 once a week for the next 3 weeks 3 each 0   No facility-administered medications prior to visit.     ROS Review of Systems  Constitutional: Negative for activity change, appetite change, diaphoresis, fatigue and unexpected weight  change.  HENT: Negative.  Negative for sinus pressure, sore throat and trouble swallowing.   Eyes: Negative for visual disturbance.  Respiratory: Negative for cough, chest tightness, shortness of breath and wheezing.   Cardiovascular: Negative for chest pain, palpitations and leg swelling.  Gastrointestinal: Positive for abdominal pain and nausea. Negative for anal bleeding, blood in stool, constipation, diarrhea, rectal pain and vomiting.  Endocrine: Negative.   Genitourinary: Negative.  Negative for difficulty urinating and hematuria.  Musculoskeletal: Negative.  Negative for back pain, joint swelling and myalgias.  Skin: Negative.  Negative for color change and rash.  Allergic/Immunologic: Negative.   Neurological: Negative.  Negative for dizziness.  Hematological: Negative for adenopathy. Does not bruise/bleed easily.  Psychiatric/Behavioral: Negative.     Objective:  BP 134/80 (BP Location: Left Arm, Patient Position: Sitting, Cuff Size: Large)   Pulse 72   Temp 98.9 F (37.2 C) (Oral)   Resp 16   Ht 5\' 5"  (1.651 m)   Wt 184 lb 12 oz (83.8 kg)   LMP 11/06/2016   SpO2 98%   BMI 30.74 kg/m   BP Readings from Last 3 Encounters:  12/03/16 134/80  09/05/15 140/88  10/25/14 112/78    Wt Readings from Last 3 Encounters:  12/03/16 184 lb 12 oz (83.8 kg)  09/05/15 184 lb (83.5 kg)  10/25/14 189 lb (85.7 kg)    Physical Exam  Constitutional:  Non-toxic appearance.  She does not have a sickly appearance. She does not appear ill. No distress.  HENT:  Mouth/Throat: Oropharynx is clear and moist. No oropharyngeal exudate.  Eyes: Conjunctivae are normal. Right eye exhibits no discharge. Left eye exhibits no discharge. No scleral icterus.  Neck: Normal range of motion. Neck supple. No JVD present. No tracheal deviation present. No thyromegaly present.  Cardiovascular: Normal rate, regular rhythm, normal heart sounds and intact distal pulses.  Exam reveals no gallop and no friction  rub.   No murmur heard. Pulmonary/Chest: Effort normal and breath sounds normal. No stridor. No respiratory distress. She has no wheezes. She has no rales. She exhibits no tenderness.  Abdominal: Soft. Normal appearance and bowel sounds are normal. There is no hepatosplenomegaly, splenomegaly or hepatomegaly. There is tenderness in the right upper quadrant and epigastric area. There is no rebound and no CVA tenderness. No hernia. Hernia confirmed negative in the ventral area, confirmed negative in the right inguinal area and confirmed negative in the left inguinal area.  Lymphadenopathy:    She has no cervical adenopathy.  Skin: She is not diaphoretic.  Vitals reviewed.   Lab Results  Component Value Date   WBC 4.4 12/03/2016   HGB 13.2 12/03/2016   HCT 40.4 12/03/2016   PLT 213.0 12/03/2016   GLUCOSE 95 12/03/2016   CHOL 210 (H) 10/25/2014   TRIG 43.0 10/25/2014   HDL 84.00 10/25/2014   LDLDIRECT 147.5 05/29/2011   LDLCALC 117 (H) 10/25/2014   ALT 13 12/03/2016   AST 17 12/03/2016   NA 136 12/03/2016   K 4.0 12/03/2016   CL 104 12/03/2016   CREATININE 0.87 12/03/2016   BUN 11 12/03/2016   CO2 25 12/03/2016   TSH 1.25 10/25/2014    US Abdomen Complete  Result Date: 09/22/2015 CLINICAL DATA:  Right upper quadrant pain. EXAM: ABDOMEN ULTRASOUND COMPLETE COMPARISON:  08/03/2013 . FINDINGS: Gallbladder: No gallstones or wall thickening visualized. No sonographic Murphy sign noted by sonographer. Common bile duct: Diameter: 3.8 mm Liver: No focal lesion identified. Within normal limits in parenchymal echogenicity. IVC: No abnormality visualized. Pancreas: Visualized portion unremarkable. Spleen: Size and appearance within normal limits. Right Kidney: Length: 9.8 cm. Echogenicity within normal limits. No mass or hydronephrosis visualized. Left Kidney: Length: 9.6 cm. Echogenicity within normal limits. No mass or hydronephrosis visualized. Abdominal aorta: No aneurysm visualized. Other  findings: None. IMPRESSION: Normal exam. Electronically Signed   By: Marcello Moores  Register   On: 09/22/2015 08:38    Assessment & Plan:   Janika was seen today for abdominal pain and gastroesophageal reflux.  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 I have asked her to undergo a HIDA scan to see if she has chronic or acute cholecystitis or gallbladder dyskinesia. -     Comprehensive metabolic panel; Future -     CBC with Differential/Platelet; Future -     Amylase; Future -     Lipase; Future -     hCG, quantitative, pregnancy; Future -     NM Hepato W/Eject Fract; Future  Gastroesophageal reflux disease with esophagitis- will start treating her symptoms with a proton pump inhibitor and an antispasmodic. -     omeprazole (PRILOSEC) 40 MG capsule; Take 1 capsule (40 mg total) by mouth daily. -  dicyclomine (BENTYL) 10 MG capsule; Take 1 capsule (10 mg total) by mouth 4 (four) times daily -  before meals and at bedtime.  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   I have discontinued Ms. Buntyn's Cyanocobalamin, cyanocobalamin, SYRINGE/NEEDLE (DISP) 1 ML, norelgestromin-ethinyl estradiol, and dexlansoprazole. I am also having her start on omeprazole and dicyclomine. Additionally, I am having her maintain her Probiotic Product (PROBIOTIC PO), Vitamin D (Ergocalciferol), B-complex with vitamin C, and vitamin C.  Meds ordered this encounter  Medications  . omeprazole (PRILOSEC) 40 MG capsule    Sig: Take 1 capsule (40 mg total) by mouth daily.    Dispense:  90  capsule    Refill:  1  . dicyclomine (BENTYL) 10 MG capsule    Sig: Take 1 capsule (10 mg total) by mouth 4 (four) times daily -  before meals and at bedtime.    Dispense:  90 capsule    Refill:  1     Follow-up: Return in about 3 weeks (around 12/24/2016).  Scarlette Calico, MD

## 2016-12-04 ENCOUNTER — Encounter: Payer: Self-pay | Admitting: Internal Medicine

## 2016-12-17 ENCOUNTER — Ambulatory Visit (HOSPITAL_COMMUNITY): Payer: BC Managed Care – PPO

## 2017-02-14 LAB — HM PAP SMEAR

## 2018-04-10 ENCOUNTER — Encounter: Payer: Self-pay | Admitting: Gastroenterology

## 2018-05-20 ENCOUNTER — Ambulatory Visit: Payer: BC Managed Care – PPO | Admitting: Gastroenterology

## 2018-07-16 ENCOUNTER — Other Ambulatory Visit (HOSPITAL_COMMUNITY): Payer: Self-pay | Admitting: Obstetrics and Gynecology

## 2018-07-16 ENCOUNTER — Other Ambulatory Visit: Payer: Self-pay | Admitting: Obstetrics and Gynecology

## 2018-07-16 DIAGNOSIS — R1900 Intra-abdominal and pelvic swelling, mass and lump, unspecified site: Secondary | ICD-10-CM

## 2018-07-16 DIAGNOSIS — N946 Dysmenorrhea, unspecified: Secondary | ICD-10-CM

## 2018-07-16 DIAGNOSIS — M25559 Pain in unspecified hip: Secondary | ICD-10-CM

## 2018-07-22 ENCOUNTER — Ambulatory Visit (HOSPITAL_COMMUNITY): Payer: BC Managed Care – PPO

## 2018-07-22 ENCOUNTER — Encounter (HOSPITAL_COMMUNITY): Payer: Self-pay

## 2018-07-31 DIAGNOSIS — D259 Leiomyoma of uterus, unspecified: Secondary | ICD-10-CM | POA: Insufficient documentation

## 2019-02-23 DIAGNOSIS — N946 Dysmenorrhea, unspecified: Secondary | ICD-10-CM | POA: Insufficient documentation

## 2019-02-23 DIAGNOSIS — N943 Premenstrual tension syndrome: Secondary | ICD-10-CM | POA: Insufficient documentation

## 2019-02-26 ENCOUNTER — Ambulatory Visit (HOSPITAL_COMMUNITY)
Admission: EM | Admit: 2019-02-26 | Discharge: 2019-02-26 | Disposition: A | Discharge status: Home / Self Care | Payer: BC Managed Care – PPO | Attending: Family Medicine | Admitting: Family Medicine

## 2019-02-26 ENCOUNTER — Other Ambulatory Visit: Payer: Self-pay

## 2019-02-26 ENCOUNTER — Encounter (HOSPITAL_COMMUNITY): Payer: Self-pay | Admitting: Family Medicine

## 2019-02-26 DIAGNOSIS — N760 Acute vaginitis: Secondary | ICD-10-CM | POA: Insufficient documentation

## 2019-02-26 LAB — POCT URINALYSIS DIP (DEVICE)
Bilirubin Urine: NEGATIVE
Glucose, UA: NEGATIVE mg/dL
Ketones, ur: NEGATIVE mg/dL
Leukocytes,Ua: NEGATIVE
Nitrite: NEGATIVE
Protein, ur: NEGATIVE mg/dL
Specific Gravity, Urine: 1.02 (ref 1.005–1.030)
Urobilinogen, UA: 0.2 mg/dL (ref 0.0–1.0)
pH: 6 (ref 5.0–8.0)

## 2019-02-26 MED ORDER — METRONIDAZOLE 500 MG PO TABS: 500.0000 mg | ORAL_TABLET | Freq: Two times a day (BID) | ORAL | 0 refills | Status: DC

## 2019-02-26 MED ORDER — FLUCONAZOLE 150 MG PO TABS: 150.0000 mg | ORAL_TABLET | Freq: Once | ORAL | 0 refills | Status: AC

## 2019-02-26 NOTE — ED Triage Notes (Signed)
Pt here for vaginal discharge and irritation

## 2019-02-26 NOTE — ED Provider Notes (Signed)
[facility]    CSN: [id number] Arrival date & time: 02/26/19  1518     History   Chief Complaint Chief Complaint  Patient presents with  . Vaginal Discharge    HPI Donna Aguilar is a 38 y.o. female.   38 yo woman here for STD evaluation, first [facility] visit.  Patient is experiencing a vaginal discharge and irritation.  She is married.  Still, she thinks she has trichomonas.  Some lower abdominal pain.  No UTI symptoms.  No flank pain or fever.  Patient understands that trichomonas is a sexually transmitted infection.  She has never had this before.  Patient is a Education officer, museum.       Past Medical History:  Diagnosis Date  . Anxiety   . Depression   . GERD (gastroesophageal reflux disease)   . PTSD (post-traumatic stress disorder)   . Right ACL tear   . Vaginal dryness    h/o    Patient Active Problem List   Diagnosis Date Noted  . 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 Abdominal pain, right upper quadrant 09/05/2015  . Vitamin B12 deficiency neuropathy ([place]) 10/25/2014  . Obesity 05/30/2011  . Routine general medical examination at a health care facility 05/29/2011  . GERD 09/22/2010    Past Surgical History:  Procedure Laterality Date  . KNEE ARTHROSCOPY WITH ANTERIOR CRUCIATE LIGAMENT (ACL) REPAIR Right 02/22/2014   Procedure: RIGHT ANTERIOR CRUCIATE LIGAMENT (ACL) RECONSTRUCTION WITH ALLOGRAFT/PARTIAL ANTERIOR MEDIAL MENISECTOMY;  Surgeon: Kerin Salen, MD;  Location: [facility];  Service: Orthopedics;  Laterality: Right;  . WISDOM TOOTH EXTRACTION      OB History    Gravida  3   Para  1   Term      Preterm      AB      Living  1     SAB      TAB      Ectopic      Multiple      Live Births               Home Medications    Prior to Admission medications   Medication Sig Start Date End Date Taking? Authorizing Provider  B Complex-C (B-COMPLEX WITH VITAMIN C) tablet Take  1 tablet by mouth daily.    [provider]  dicyclomine (BENTYL) 10 MG capsule Take 1 capsule (10 mg total) by mouth 4 (four) times daily -  before meals and at bedtime. 12/03/16   Janith Lima, MD  fluconazole (DIFLUCAN) 150 MG tablet Take 1 tablet (150 mg total) by mouth once for 1 dose. Repeat if needed 02/26/19 02/26/19  Robyn Haber, MD  metroNIDAZOLE (FLAGYL) 500 MG tablet Take 1 tablet (500 mg total) by mouth 2 (two) times daily. 02/26/19   Robyn Haber, MD  omeprazole (PRILOSEC) 40 MG capsule Take 1 capsule (40 mg total) by mouth daily. 12/03/16   Janith Lima, MD  Probiotic Product (PROBIOTIC PO) Take by mouth.      [provider]  vitamin C (ASCORBIC ACID) 500 MG tablet Take 500 mg by mouth daily.    [provider]  Vitamin D, Ergocalciferol, (DRISDOL) 50000 UNITS CAPS capsule Take 1 capsule by mouth 2 (two) times a week. Emmie Niemann, Sat) 05/13/14   [provider]  venlafaxine (EFFEXOR-XR) 75 MG 24 hr capsule Take 75 mg by mouth daily.    10/12/11  [provider]    Family  History Family History  Problem Relation Age of Onset  . Arthritis Other   . Hyperlipidemia Other   . Stroke Other   . Mental illness Other   . Hypertension Mother   . Heart attack Maternal Grandmother   . Hypertension Maternal Grandmother   . Diabetes Paternal Grandmother     Social History Social History   Tobacco Use  . Smoking status: Current Some Day Smoker    Packs/day: 0.50    Years: 10.00    Pack years: 5.00    Types: E-cigarettes    Last attempt to quit: 01/02/2011    Years since quitting: 8.1  . Smokeless tobacco: Never Used  Substance Use Topics  . Alcohol use: Yes    Alcohol/week: 2.0 standard drinks    Types: 2 Glasses of wine per week    Comment: social  . Drug use: No     Allergies   Patient has no known allergies.   Review of Systems Review of Systems   Physical Exam Triage Vital Signs ED Triage Vitals  Enc Vitals Group      BP      Pulse      Resp      Temp      Temp src      SpO2      Weight      Height      Head Circumference      Peak Flow      Pain Score      Pain Loc      Pain Edu?      Excl. in [place]?    No data found.  Updated Vital Signs BP (!) 135/94 (BP Location: Right Arm)   Pulse 82   Temp 99 F (37.2 C) (Oral)   Resp 18   SpO2 99%    Physical Exam Vitals signs reviewed.  Constitutional:      Appearance: Normal appearance. She is normal weight.  Eyes:     Conjunctiva/sclera: Conjunctivae normal.  Neck:     Musculoskeletal: Normal range of motion and neck supple.  Pulmonary:     Effort: Pulmonary effort is normal.  Musculoskeletal: Normal range of motion.  Skin:    General: Skin is warm and dry.  Neurological:     General: No focal deficit present.     Mental Status: She is alert and oriented to person, place, and time.  Psychiatric:        Mood and Affect: Mood normal.        Behavior: Behavior normal.        Thought Content: Thought content normal.        Judgment: Judgment normal.      UC Treatments / Results  Labs (all labs ordered are listed, but only abnormal results are displayed) Labs Reviewed  CERVICOVAGINAL ANCILLARY ONLY    EKG   Radiology No results found.  Procedures Procedures (including critical care time)  Medications Ordered in UC Medications - No data to display  Initial Impression / Assessment and Plan / UC Course  I have reviewed the triage vital signs and the nursing notes.  Pertinent labs & imaging results that were available during my care of the patient were reviewed by me and considered in my medical decision making (see chart for details).    Final Clinical Impressions(s) / UC Diagnoses   Final diagnoses:  Acute vaginitis   Discharge Instructions   None    ED Prescriptions    Medication  Sig Dispense Auth. Provider   metroNIDAZOLE (FLAGYL) 500 MG tablet Take 1 tablet (500 mg total) by mouth 2 (two) times daily.  14 tablet Robyn Haber, MD   fluconazole (DIFLUCAN) 150 MG tablet Take 1 tablet (150 mg total) by mouth once for 1 dose. Repeat if needed 2 tablet Robyn Haber, MD     Controlled Substance Prescriptions [place] Controlled Substance Registry consulted? Not Applicable   Robyn Haber, MD 02/26/19 [phone]

## 2019-02-28 LAB — CERVICOVAGINAL ANCILLARY ONLY
Chlamydia: NEGATIVE
Neisseria Gonorrhea: NEGATIVE
Trichomonas: NEGATIVE

## 2019-05-29 ENCOUNTER — Other Ambulatory Visit
Admission: RE | Admit: 2019-05-29 | Discharge: 2019-05-29 | Disposition: A | Discharge status: Home / Self Care | Payer: BC Managed Care – PPO | Source: Ambulatory Visit | Attending: Otolaryngology | Admitting: Otolaryngology

## 2019-05-29 ENCOUNTER — Other Ambulatory Visit: Payer: Self-pay

## 2019-05-29 DIAGNOSIS — Z01812 Encounter for preprocedural laboratory examination: Secondary | ICD-10-CM | POA: Diagnosis present

## 2019-05-29 DIAGNOSIS — Z20828 Contact with and (suspected) exposure to other viral communicable diseases: Secondary | ICD-10-CM | POA: Insufficient documentation

## 2019-05-29 LAB — SARS CORONAVIRUS 2 (TAT 6-24 HRS): SARS Coronavirus 2: NEGATIVE

## 2019-06-01 NOTE — Discharge Instructions (Signed)
T & A INSTRUCTION SHEET - [facility]  P. Malon Kindle, MD  [address] [place], [place] [address] TEL. [phone] [address] [place] [address] [phone]  INFORMATION SHEET FOR A TONSILLECTOMY AND ADENDOIDECTOMY  About Your Tonsils and Adenoids The tonsils and adenoids are normal body tissues that are part of our immune system. They normally help to protect [place] against diseases that may enter our mouth and nose. However, sometimes the tonsils and/or adenoids become too large and obstruct our breathing, especially at night.  If either of these things happen it helps to remove the tonsils and adenoids in order to become healthier. The operation to remove the tonsils and adenoids is called a tonsillectomy and adenoidectomy.  The Location of Your Tonsils and Adenoids The tonsils are located in the back of the throat on both side and sit in a cradle of muscles. The adenoids are located in the roof of the mouth, behind the nose, and closely associated with the opening of the Eustachian tube to the ear.  Surgery on Tonsils and Adenoids A tonsillectomy and adenoidectomy is a short operation which takes about thirty minutes. This includes being put to sleep and being awakened. Tonsillectomies and adenoidectomies are performed at [facility] and may require observation period in the recovery room prior to going home. Children are required to remain in the recovery area for 45 minutes after surgery.  Following the Operation for a Tonsillectomy A cautery machine is used to control bleeding.  Bleeding from a tonsillectomy and adenoidectomy is minimal and postoperatively the risk of bleeding is approximately four percent, although this rarely life threatening.  After your tonsillectomy and adenoidectomy post-op care at home: 1. Our patients are able to go home the same day. You may be given prescriptions for  pain medications and antibiotics, if indicated. 2. It is extremely important to remember that fluid intake is of utmost importance after a tonsillectomy. The amount that you drink must be maintained in the postoperative period. A good indication of whether a child is getting enough fluid is whether his/her urine output is constant.  As long as children are urinating or wetting their diaper every 6 - 8 hours this is usually enough fluid intake.   3. Although rare, this is a risk of some bleeding in the first ten days after surgery. This usually occurs between day five and nine postoperatively. This risk of bleeding is approximately four percent.  If you or your child should have any bleeding you should remain calm and notify our office or go directly to the Emergency Room at [facility] where they will contact us. Our doctors are available seven days a week for notification. We recommend sitting up quietly in a chair, place an ice pack on the front of the neck and spitting out the blood gently until we are able to contact you. Adults should gargle gently with ice water and this may help stop the bleeding. If the bleeding does not stop after a short time, i.e. 10 to 15 minutes, or seems to be increasing again, please contact us or go to the hospital.   4. It is common for the pain to be worse at 5 - 7 days postoperatively. This occurs because the scab is peeling off and the mucous membrane (skin of the throat) is growing back where the tonsils were.   5. It is common for a low-grade fever,  less than 102, during the first week after a tonsillectomy and adenoidectomy. It is usually due to not drinking enough liquids, and we suggest your use liquid Tylenol (acetaminophen) or the pain medicine with Tylenol (acetaminophen) prescribed in order to keep your temperature below 102. Please follow the directions on the back of the bottle. 6. Do not take aspirin or any products that contain aspirin  such as Bufferin, Anacin, Ecotrin, aspirin gum, Goodies, BC headache powders, etc., after a T&A because it can promote bleeding.  DO NOT TAKE MOTRIN OR IBUPROFEN. Please check with our office before administering any other medication that may been prescribed by other doctors during the two-week post-operative period. 7. If you happen to look in the mirror or into your child's mouth you will see white/gray patches on the back of the throat.  This is what a scab looks like in the mouth and is normal after having a tonsillectomy and adenoidectomy. It will disappear once the tonsil area heals completely. However, it may cause a noticeable odor, and this too will disappear with time.     8. You or your child may experience ear pain after having a tonsillectomy and adenoidectomy. This is called referred pain and comes from the throat, but it is felt in the ears. Ear pain is quite common and expected. It will usually go away after ten days. There is usually nothing wrong with the ears, and it is primarily due to the healing area stimulating the nerve to the ear that runs along the side of the throat. Use either the prescribed pain medicine or Tylenol (acetaminophen) as needed.  9. The throat tissues after a tonsillectomy are obviously sensitive. Smoking around children who have had a tonsillectomy significantly increases the risk of bleeding.  DO NOT SMOKE! What to Expect Each Day  First Day at Home 1. Patients will be discharged home the same day.  2. Drink at least four glasses of liquid a day. Clear, cool liquids are recommended. Fruit juices containing citric acid are not recommended because they tend to cause pain. Carbonated beverages are allowed if you pour them from glass to glass to remove the bubbles as these tend to cause discomfort. Avoid alcoholic beverages.  3. Eat very soft foods such as soups, broth, jello, custard, pudding, ice cream, popsicles, applesauce, mashed potatoes, and in general anything  that you can crush between your tongue and the roof of your mouth. Try adding [employer] Mix into your food for extra calories. It is not uncommon to lose 5 to 10 pounds of fluid weight. The weight will be gained back quickly once you're feeling better and drinking more.  4. Sleep with your head elevated on two pillows for about three days to help decrease the swelling.  5. DO NOT SMOKE!  Day Two  1. Rest as much as possible. Use common sense in your activities.  2. Continue drinking at least four glasses of liquid per day.  3. Follow the soft diet.  4. Use your pain medication as needed.  Day Three  1. Advance your activity as you are able and continue to follow the previous day's suggestions.  Days Four Through Six  1. Advance your diet and begin to eat more solid foods such as chopped hamburger. 2. Advance your activities slowly. Children should be kept mostly around the house.  3. Not uncommonly, there will be more pain at this time. It is temporary, usually lasting a day or two.  Day Seven  Through Ten  1. Most individuals by this time are able to return to work or school unless otherwise instructed. Consider sending children back to school for a half day on the first day back.   General Anesthesia, Adult, Care After This sheet gives you information about how to care for yourself after your procedure. Your health care provider may also give you more specific instructions. If you have problems or questions, contact your health care provider. What can I expect after the procedure? After the procedure, the following side effects are common:  Pain or discomfort at the IV site.  Nausea.  Vomiting.  Sore throat.  Trouble concentrating.  Feeling cold or chills.  Weak or tired.  Sleepiness and fatigue.  Soreness and body aches. These side effects can affect parts of the body that were not involved in surgery. Follow these instructions at home:  For at least 24  hours after the procedure:  Have a responsible adult stay with you. It is important to have someone help care for you until you are awake and alert.  Rest as needed.  Do not: ? Participate in activities in which you could fall or become injured. ? Drive. ? Use heavy machinery. ? Drink alcohol. ? Take sleeping pills or medicines that cause drowsiness. ? Make important decisions or sign legal documents. ? Take care of children on your own. Eating and drinking  Follow any instructions from your health care provider about eating or drinking restrictions.  When you feel hungry, start by eating small amounts of foods that are soft and easy to digest (bland), such as toast. Gradually return to your regular diet.  Drink enough fluid to keep your urine pale yellow.  If you vomit, rehydrate by drinking water, juice, or clear broth. General instructions  If you have sleep apnea, surgery and certain medicines can increase your risk for breathing problems. Follow instructions from your health care provider about wearing your sleep device: ? Anytime you are sleeping, including during daytime naps. ? While taking prescription pain medicines, sleeping medicines, or medicines that make you drowsy.  Return to your normal activities as told by your health care provider. Ask your health care provider what activities are safe for you.  Take over-the-counter and prescription medicines only as told by your health care provider.  If you smoke, do not smoke without supervision.  Keep all follow-up visits as told by your health care provider. This is important. Contact a health care provider if:  You have nausea or vomiting that does not get better with medicine.  You cannot eat or drink without vomiting.  You have pain that does not get better with medicine.  You are unable to pass urine.  You develop a skin rash.  You have a fever.  You have redness around your IV site that gets worse. Get  help right away if:  You have difficulty breathing.  You have chest pain.  You have blood in your urine or stool, or you vomit blood. Summary  After the procedure, it is common to have a sore throat or nausea. It is also common to feel tired.  Have a responsible adult stay with you for the first 24 hours after general anesthesia. It is important to have someone help care for you until you are awake and alert.  When you feel hungry, start by eating small amounts of foods that are soft and easy to digest (bland), such as toast. Gradually return to your regular diet.  Drink enough fluid to keep your urine pale yellow.  Return to your normal activities as told by your health care provider. Ask your health care provider what activities are safe for you. This information is not intended to replace advice given to you by your health care provider. Make sure you discuss any questions you have with your health care provider. Document Released: 10/22/2000 Document Revised: 07/19/2017 Document Reviewed: 03/01/2017 Elsevier Patient Education  2020 [employer].

## 2019-06-02 ENCOUNTER — Ambulatory Visit
Admission: RE | Admit: 2019-06-02 | Discharge: 2019-06-02 | Disposition: A | Discharge status: Home / Self Care | Payer: BC Managed Care – PPO | Attending: Otolaryngology | Admitting: Otolaryngology

## 2019-06-02 ENCOUNTER — Ambulatory Visit: Payer: BC Managed Care – PPO | Admitting: Anesthesiology

## 2019-06-02 ENCOUNTER — Other Ambulatory Visit: Payer: Self-pay

## 2019-06-02 ENCOUNTER — Encounter
Admission: RE | Disposition: A | Discharge status: Home / Self Care | Payer: Self-pay | Source: Home / Self Care | Attending: Otolaryngology

## 2019-06-02 DIAGNOSIS — J358 Other chronic diseases of tonsils and adenoids: Secondary | ICD-10-CM | POA: Insufficient documentation

## 2019-06-02 DIAGNOSIS — Z885 Allergy status to narcotic agent status: Secondary | ICD-10-CM | POA: Insufficient documentation

## 2019-06-02 DIAGNOSIS — F17200 Nicotine dependence, unspecified, uncomplicated: Secondary | ICD-10-CM | POA: Insufficient documentation

## 2019-06-02 DIAGNOSIS — G629 Polyneuropathy, unspecified: Secondary | ICD-10-CM | POA: Insufficient documentation

## 2019-06-02 HISTORY — PX: TONSILLECTOMY: SHX5217

## 2019-06-02 LAB — POCT PREGNANCY, URINE: Preg Test, Ur: NEGATIVE

## 2019-06-02 SURGERY — TONSILLECTOMY
Anesthesia: General | Site: Mouth | Laterality: Bilateral

## 2019-06-02 MED ORDER — FENTANYL CITRATE (PF) 100 MCG/2ML IJ SOLN
INTRAMUSCULAR | Status: DC | PRN
  Administered 2019-06-02: 100 ug via INTRAVENOUS

## 2019-06-02 MED ORDER — ONDANSETRON HCL 4 MG/2ML IJ SOLN: 4.0000 mg | Freq: Once | INTRAMUSCULAR | Status: DC | PRN

## 2019-06-02 MED ORDER — PROPOFOL 10 MG/ML IV BOLUS
INTRAVENOUS | Status: DC | PRN
  Administered 2019-06-02: 200 mg via INTRAVENOUS

## 2019-06-02 MED ORDER — HYDROCODONE-ACETAMINOPHEN 7.5-325 MG/15ML PO SOLN: ORAL | 0 refills | Status: DC

## 2019-06-02 MED ORDER — FENTANYL CITRATE (PF) 100 MCG/2ML IJ SOLN
25.0000 ug | INTRAMUSCULAR | Status: DC | PRN
  Administered 2019-06-02: 25 ug via INTRAVENOUS

## 2019-06-02 MED ORDER — ONDANSETRON HCL 4 MG/2ML IJ SOLN
INTRAMUSCULAR | Status: DC | PRN
  Administered 2019-06-02: 4 mg via INTRAVENOUS

## 2019-06-02 MED ORDER — MIDAZOLAM HCL 5 MG/5ML IJ SOLN
INTRAMUSCULAR | Status: DC | PRN
  Administered 2019-06-02: 2 mg via INTRAVENOUS

## 2019-06-02 MED ORDER — LACTATED RINGERS IV SOLN
INTRAVENOUS | Status: DC
  Administered 2019-06-02: 08:00:00 via INTRAVENOUS

## 2019-06-02 MED ORDER — PREDNISOLONE SODIUM PHOSPHATE 15 MG/5ML PO SOLN: ORAL | 0 refills | Status: DC

## 2019-06-02 MED ORDER — DEXAMETHASONE SODIUM PHOSPHATE 4 MG/ML IJ SOLN
INTRAMUSCULAR | Status: DC | PRN
  Administered 2019-06-02: 4 mg via INTRAVENOUS

## 2019-06-02 MED ORDER — OXYCODONE HCL 5 MG PO TABS: 5.0000 mg | ORAL_TABLET | Freq: Once | ORAL | Status: AC | PRN

## 2019-06-02 MED ORDER — OXYCODONE HCL 5 MG/5ML PO SOLN
5.0000 mg | Freq: Once | ORAL | Status: AC | PRN
  Administered 2019-06-02: 09:00:00 5 mg via ORAL

## 2019-06-02 MED ORDER — ACETAMINOPHEN 10 MG/ML IV SOLN
1000.0000 mg | Freq: Once | INTRAVENOUS | Status: AC
  Administered 2019-06-02: 1000 mg via INTRAVENOUS

## 2019-06-02 MED ORDER — LIDOCAINE HCL (CARDIAC) PF 100 MG/5ML IV SOSY
PREFILLED_SYRINGE | INTRAVENOUS | Status: DC | PRN
  Administered 2019-06-02: 30 mg via INTRAVENOUS

## 2019-06-02 MED ORDER — SUCCINYLCHOLINE CHLORIDE 20 MG/ML IJ SOLN
INTRAMUSCULAR | Status: DC | PRN
  Administered 2019-06-02: 100 mg via INTRAVENOUS

## 2019-06-02 MED ORDER — DEXMEDETOMIDINE HCL 200 MCG/2ML IV SOLN
INTRAVENOUS | Status: DC | PRN
  Administered 2019-06-02 (×2): 10 ug via INTRAVENOUS

## 2019-06-02 MED ORDER — GLYCOPYRROLATE 0.2 MG/ML IJ SOLN
INTRAMUSCULAR | Status: DC | PRN
  Administered 2019-06-02: 0.1 mg via INTRAVENOUS

## 2019-06-02 MED ORDER — BUPIVACAINE-EPINEPHRINE 0.25% -1:200000 IJ SOLN
INTRAMUSCULAR | Status: DC | PRN
  Administered 2019-06-02: 3 mL

## 2019-06-02 SURGICAL SUPPLY — 15 items
BLADE BOVIE TIP EXT 4 (BLADE) ×2 IMPLANT
CANISTER SUCT 1200ML W/VALVE (MISCELLANEOUS) ×2 IMPLANT
CATH ROBINSON RED A/P 10FR (CATHETERS) ×2 IMPLANT
COAG SUCT 10F 3.5MM HAND CTRL (MISCELLANEOUS) ×2 IMPLANT
DECANTER SPIKE VIAL GLASS SM (MISCELLANEOUS) ×2 IMPLANT
ELECT REM PT RETURN 9FT ADLT (ELECTROSURGICAL) ×2
ELECTRODE REM PT RTRN 9FT ADLT (ELECTROSURGICAL) ×1 IMPLANT
GLOVE BIO SURGEON STRL SZ7.5 (GLOVE) ×2 IMPLANT
KIT TURNOVER KIT A (KITS) ×2 IMPLANT
NS IRRIG 500ML POUR BTL (IV SOLUTION) ×2 IMPLANT
PACK TONSIL AND ADENOID CUSTOM (PACKS) ×2 IMPLANT
PENCIL SMOKE EVACUATOR (MISCELLANEOUS) ×2 IMPLANT
SLEEVE SUCTION 125 (MISCELLANEOUS) ×2 IMPLANT
SOL ANTI-FOG 6CC FOG-OUT (MISCELLANEOUS) ×1 IMPLANT
SOL FOG-OUT ANTI-FOG 6CC (MISCELLANEOUS) ×1

## 2019-06-02 NOTE — Transfer of Care (Signed)
Immediate Anesthesia Transfer of Care Note  Patient: Donna Aguilar  Procedure(s) Performed: TONSILLECTOMY (Bilateral Mouth)  Patient Location: PACU  Anesthesia Type: General  Level of Consciousness: awake, alert  and patient cooperative  Airway and Oxygen Therapy: Patient Spontanous Breathing and Patient connected to supplemental oxygen  Post-op Assessment: Post-op Vital signs reviewed, Patient's Cardiovascular Status Stable, Respiratory Function Stable, Patent Airway and No signs of Nausea or vomiting  Post-op Vital Signs: Reviewed and stable  Complications: No apparent anesthesia complications

## 2019-06-02 NOTE — Anesthesia Preprocedure Evaluation (Signed)
Anesthesia Evaluation  Patient identified by MRN, date of birth, ID band Patient awake    Reviewed: Allergy & Precautions, H&P , NPO status , Patient's Chart, lab work & pertinent test results, reviewed documented beta blocker date and time   Airway Mallampati: II  TM Distance: >3 FB Neck ROM: full    Dental no notable dental hx.    Pulmonary Current Smoker,    Pulmonary exam normal breath sounds clear to auscultation       Cardiovascular Exercise Tolerance: Good negative cardio ROS   Rhythm:regular Rate:Normal     Neuro/Psych PSYCHIATRIC DISORDERS Anxiety Depression  Neuromuscular disease (vit B12 deficiency neuropathy)    GI/Hepatic Neg liver ROS, GERD  ,  Endo/Other  negative endocrine ROS  Renal/GU negative Renal ROS  negative genitourinary   Musculoskeletal   Abdominal   Peds  Hematology negative hematology ROS (+)   Anesthesia Other Findings   Reproductive/Obstetrics negative OB ROS                             Anesthesia Physical Anesthesia Plan  ASA: II  Anesthesia Plan: General   Post-op Pain Management:    Induction:   PONV Risk Score and Plan: 2 and Dexamethasone and Ondansetron  Airway Management Planned:   Additional Equipment:   Intra-op Plan:   Post-operative Plan:   Informed Consent: I have reviewed the patients History and Physical, chart, labs and discussed the procedure including the risks, benefits and alternatives for the proposed anesthesia with the patient or authorized representative who has indicated his/her understanding and acceptance.     Dental Advisory Given  Plan Discussed with: CRNA  Anesthesia Plan Comments:         Anesthesia Quick Evaluation

## 2019-06-02 NOTE — H&P (Signed)
History and physical reviewed and will be scanned in later. No change in medical status reported by the patient or family, appears stable for surgery. All questions regarding the procedure answered, and patient (or family if a child) expressed understanding of the procedure. ? ?Donna Aguilar S Harmoney Donna Aguilar ?@TODAY@ ?

## 2019-06-02 NOTE — Op Note (Signed)
06/02/2019  8:55 AM    Donna Aguilar  [id number]   Pre-Op Diagnosis:  Tonsillolithiasis  Post-op Diagnosis: Tonsillolithiasis  Procedure: Tonsillectomy  Surgeon:  Riley Nearing., MD  Anesthesia:  General endotracheal  EBL:  Less than 25 cc  Complications:  None  Findings: 2+ cryptic tonsils with tonsillith debris. No significant adenoid tissue  Procedure: The patient was taken to the Operating Room and placed in the supine position.  After induction of general endotracheal anesthesia, the table was turned 90 degrees and the patient was draped in the usual fashion  with the eyes protected.  A mouth gag was inserted into the oral cavity to open the mouth, and examination of the oropharynx showed the uvula was non-bifid. The palate was palpated, and there was no evidence of submucous cleft. Examination of the nasopharynx showed no obstructing adenoids. The right tonsil was grasped with an Allis clamp and resected from the tonsillar fossa in the usual fashion with the Bovie. The left tonsil was resected in the same fashion. The Bovie was used to obtain hemostasis. Each tonsillar fossa was then carefully injected with 0.25% marcaine with epinephrine, 1:200,000, avoiding intravascular injection. The nose and throat were irrigated and suctioned to remove any  blood clot. The mouth gag was  removed with no evidence of active bleeding.  The patient was then returned to the anesthesiologist for awakening, and was taken to the Recovery Room in stable condition.  Cultures:  None.  Specimens:  Tonsils.  Disposition:   PACU to home  Plan: Soft, bland diet and push fluids. Take pain medications and antibiotics as prescribed. No strenuous activity for 2 weeks. Follow-up in 3 weeks.  Riley Nearing 06/02/2019 8:55 AM

## 2019-06-02 NOTE — Anesthesia Postprocedure Evaluation (Signed)
Anesthesia Post Note  Patient: Donna Aguilar  Procedure(s) Performed: TONSILLECTOMY (Bilateral Mouth)     Patient location during evaluation: PACU Anesthesia Type: General Level of consciousness: awake and alert Pain management: pain level controlled Vital Signs Assessment: post-procedure vital signs reviewed and stable Respiratory status: spontaneous breathing, nonlabored ventilation, respiratory function stable and patient connected to nasal cannula oxygen Cardiovascular status: blood pressure returned to baseline and stable Postop Assessment: no apparent nausea or vomiting Anesthetic complications: no    Alisa Graff

## 2019-06-02 NOTE — Anesthesia Procedure Notes (Signed)
Procedure Name: Intubation Date/Time: 06/02/2019 8:32 AM Performed by: Cameron Ali, CRNA Pre-anesthesia Checklist: Patient identified, Emergency Drugs available, Suction available, Patient being monitored and Timeout performed Patient Re-evaluated:Patient Re-evaluated prior to induction Oxygen Delivery Method: Circle system utilized Preoxygenation: Pre-oxygenation with 100% oxygen Induction Type: IV induction Ventilation: Mask ventilation without difficulty Laryngoscope Size: Mac and 3 Grade View: Grade I Tube type: Oral Rae Tube size: 7.0 mm Number of attempts: 1 Placement Confirmation: ETT inserted through vocal cords under direct vision,  positive ETCO2 and breath sounds checked- equal and bilateral Tube secured with: Tape Dental Injury: Teeth and Oropharynx as per pre-operative assessment

## 2019-06-03 ENCOUNTER — Encounter: Payer: Self-pay | Admitting: Otolaryngology

## 2019-06-04 LAB — SURGICAL PATHOLOGY

## 2019-07-16 ENCOUNTER — Other Ambulatory Visit: Payer: Self-pay

## 2019-07-16 ENCOUNTER — Emergency Department (HOSPITAL_BASED_OUTPATIENT_CLINIC_OR_DEPARTMENT_OTHER)
Admission: EM | Admit: 2019-07-16 | Discharge: 2019-07-16 | Disposition: A | Discharge status: Home / Self Care | Payer: BC Managed Care – PPO | Attending: Emergency Medicine | Admitting: Emergency Medicine

## 2019-07-16 ENCOUNTER — Emergency Department (HOSPITAL_BASED_OUTPATIENT_CLINIC_OR_DEPARTMENT_OTHER): Payer: BC Managed Care – PPO

## 2019-07-16 ENCOUNTER — Encounter (HOSPITAL_BASED_OUTPATIENT_CLINIC_OR_DEPARTMENT_OTHER): Payer: Self-pay

## 2019-07-16 DIAGNOSIS — R0789 Other chest pain: Secondary | ICD-10-CM | POA: Insufficient documentation

## 2019-07-16 DIAGNOSIS — F17210 Nicotine dependence, cigarettes, uncomplicated: Secondary | ICD-10-CM | POA: Diagnosis not present

## 2019-07-16 DIAGNOSIS — Z885 Allergy status to narcotic agent status: Secondary | ICD-10-CM | POA: Insufficient documentation

## 2019-07-16 DIAGNOSIS — R079 Chest pain, unspecified: Secondary | ICD-10-CM

## 2019-07-16 DIAGNOSIS — Z79899 Other long term (current) drug therapy: Secondary | ICD-10-CM | POA: Insufficient documentation

## 2019-07-16 LAB — CBC WITH DIFFERENTIAL/PLATELET
Abs Immature Granulocytes: 0.01 10*3/uL (ref 0.00–0.07)
Basophils Absolute: 0 10*3/uL (ref 0.0–0.1)
Basophils Relative: 1 %
Eosinophils Absolute: 0 10*3/uL (ref 0.0–0.5)
Eosinophils Relative: 1 %
HCT: 39.9 % (ref 36.0–46.0)
Hemoglobin: 12.9 g/dL (ref 12.0–15.0)
Immature Granulocytes: 0 %
Lymphocytes Relative: 60 %
Lymphs Abs: 3.3 10*3/uL (ref 0.7–4.0)
MCH: 29.9 pg (ref 26.0–34.0)
MCHC: 32.3 g/dL (ref 30.0–36.0)
MCV: 92.4 fL (ref 80.0–100.0)
Monocytes Absolute: 0.4 10*3/uL (ref 0.1–1.0)
Monocytes Relative: 7 %
Neutro Abs: 1.7 10*3/uL (ref 1.7–7.7)
Neutrophils Relative %: 31 %
Platelets: 221 10*3/uL (ref 150–400)
RBC: 4.32 MIL/uL (ref 3.87–5.11)
RDW: 14.6 % (ref 11.5–15.5)
WBC: 5.5 10*3/uL (ref 4.0–10.5)
nRBC: 0 % (ref 0.0–0.2)

## 2019-07-16 LAB — BASIC METABOLIC PANEL
Anion gap: 6 (ref 5–15)
BUN: 15 mg/dL (ref 6–20)
CO2: 26 mmol/L (ref 22–32)
Calcium: 8.6 mg/dL — ABNORMAL LOW (ref 8.9–10.3)
Chloride: 105 mmol/L (ref 98–111)
Creatinine, Ser: 0.88 mg/dL (ref 0.44–1.00)
GFR calc Af Amer: >60 mL/min (ref >60)
GFR calc non Af Amer: >60 mL/min (ref >60)
Glucose, Bld: 92 mg/dL (ref 70–99)
Potassium: 4.1 mmol/L (ref 3.5–5.1)
Sodium: 137 mmol/L (ref 135–145)

## 2019-07-16 LAB — TROPONIN I (HIGH SENSITIVITY)
Troponin I (High Sensitivity): <2 ng/L (ref <18)
Troponin I (High Sensitivity): <2 ng/L (ref <18)

## 2019-07-16 MED ORDER — NAPROXEN 500 MG PO TABS: 500.0000 mg | ORAL_TABLET | Freq: Two times a day (BID) | ORAL | 0 refills | Status: DC

## 2019-07-16 MED ORDER — METHOCARBAMOL 500 MG PO TABS: 500.0000 mg | ORAL_TABLET | Freq: Three times a day (TID) | ORAL | 0 refills | Status: DC | PRN

## 2019-07-16 NOTE — ED Notes (Signed)
Attempted IV, unsuccessful; able to draw small amt blood in light green tube

## 2019-07-16 NOTE — Discharge Instructions (Addendum)
You were seen in the emergency department today for chest pain. Your work-up in the emergency department has been overall reassuring. Your labs have been fairly normal and or similar to previous blood work you have had done. Your calcium was mildly low- please try to include calcium rich foods in your diet. Your EKG and the enzyme we use to check your heart did not show an acute heart attack at this time. Your chest x-ray was normal.   We are sending you home with the following medicines to try to help with the discomfort:  - Naproxen is a nonsteroidal anti-inflammatory medication that will help with pain and swelling. Be sure to take this medication as prescribed with food, 1 pill every 12 hours,  It should be taken with food, as it can cause stomach upset, and more seriously, stomach bleeding. Do not take other nonsteroidal anti-inflammatory medications with this such as Advil, Motrin, Aleve, Mobic, Goodie Powder, or Motrin.    - Robaxin is the muscle relaxer I have prescribed, this is meant to help with muscle tightness. Be aware that this medication may make you drowsy therefore the first time you take this it should be at a time you are in an environment where you can rest. Do not drive or operate heavy machinery when taking this medication. Do not drink alcohol or take other sedating medications with this medicine such as narcotics or benzodiazepines.   You make take Tylenol per over the counter dosing with these medications.   We have prescribed you new medication(s) today. Discuss the medications prescribed today with your pharmacist as they can have adverse effects and interactions with your other medicines including over the counter and prescribed medications. Seek medical evaluation if you start to experience new or abnormal symptoms after taking one of these medicines, seek care immediately if you start to experience difficulty breathing, feeling of your throat closing, facial swelling, or rash  as these could be indications of a more serious allergic reaction    We would like you to follow up closely with your primary care provider and/or the cardiologist provided in your discharge instructions within 1-3 days. Return to the ER immediately should you experience any new or worsening symptoms including but not limited to return of pain, worsened pain, vomiting, shortness of breath, dizziness, lightheadedness, passing out, or any other concerns that you may have.

## 2019-07-16 NOTE — ED Triage Notes (Signed)
Pt c/o CP X7-10 days. Pt states pain is on left side radiating into back and left shoulder.

## 2019-07-16 NOTE — ED Provider Notes (Signed)
[facility] EMERGENCY DEPARTMENT Provider Note   CSN: [id number] Arrival date & time: 07/16/19  1412     History Chief Complaint  Patient presents with  . Chest Pain    Donna Aguilar is a 38 y.o. female with a hx of anxiety, depression, PTSD, tobacco use, & GERD who presents to the ED with complaints of intermittent chest pain for the past 7-10 days. Patient states pain is located in the central to left chest, radiates around to the L side/back, occurs intermittently, estimates 5-10 episodes per day, lasting less than a minute in duration. Pain is difficult to describe. Seems to change with when she is wearing a mask as she has to wear one all day at work and thinks when she takes it off the pain occurs more. She recently started doing yoga within the past few weeks, did note pain was worse in certain positions at yoga the other day. Denies nausea, vomiting, diaphoresis, dyspnea, lightheadedness, dizziness, syncope,  leg pain/swelling, hemoptysis, recent surgery/trauma within past 4 weeks ( did have tonsillectomy 06/02/19) , recent long travel, hormone use, personal hx of cancer, or hx of DVT/PE. Denies pregnancy.   HPI     Past Medical History:  Diagnosis Date  . Anxiety   . Depression   . GERD (gastroesophageal reflux disease)   . PTSD (post-traumatic stress disorder)   . Right ACL tear   . Vaginal dryness    h/o    Patient Active Problem List   Diagnosis Date Noted  . 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 Abdominal pain, right upper quadrant 09/05/2015  . Vitamin B12 deficiency neuropathy ([place]) 10/25/2014  . Obesity 05/30/2011  . Routine general medical examination at a health care facility 05/29/2011  . GERD 09/22/2010    Past Surgical History:  Procedure Laterality Date  . KNEE ARTHROSCOPY WITH ANTERIOR CRUCIATE LIGAMENT (ACL) REPAIR Right 02/22/2014   Procedure: RIGHT ANTERIOR CRUCIATE LIGAMENT (ACL) RECONSTRUCTION WITH  ALLOGRAFT/PARTIAL ANTERIOR MEDIAL MENISECTOMY;  Surgeon: Kerin Salen, MD;  Location: [facility];  Service: Orthopedics;  Laterality: Right;  . TONSILLECTOMY Bilateral 06/02/2019   Procedure: TONSILLECTOMY;  Surgeon: Clyde Canterbury, MD;  Location: [facility];  Service: ENT;  Laterality: Bilateral;  . WISDOM TOOTH EXTRACTION       OB History    Gravida  3   Para  1   Term      Preterm      AB      Living  1     SAB      TAB      Ectopic      Multiple      Live Births              Family History  Problem Relation Age of Onset  . Hypertension Mother   . Arthritis Other   . Hyperlipidemia Other   . Stroke Other   . Mental illness Other   . Heart attack Maternal Grandmother   . Hypertension Maternal Grandmother   . Diabetes Paternal Grandmother     Social History   Tobacco Use  . Smoking status: Current Some Day Smoker    Packs/day: 0.25    Years: 10.00    Pack years: 2.50    Types: E-cigarettes    Last attempt to quit: 01/02/2011    Years since quitting: 8.5  . Smokeless tobacco: Never Used  Substance Use Topics  . Alcohol use: Yes  Alcohol/week: 6.0 - 8.0 standard drinks    Types: 6 - 8 Glasses of wine per week  . Drug use: No    Home Medications Prior to Admission medications   Medication Sig Start Date End Date Taking? Authorizing Provider  B Complex-C (B-COMPLEX WITH VITAMIN C) tablet Take 1 tablet by mouth daily.    [provider]  dicyclomine (BENTYL) 10 MG capsule Take 1 capsule (10 mg total) by mouth 4 (four) times daily -  before meals and at bedtime. Patient not taking: Reported on 05/25/2019 12/03/16   Janith Lima, MD  HYDROcodone-acetaminophen (HYCET) 7.5-325 mg/15 ml solution 10-15cc every 4-6 hours as needed for pain 06/02/19   Clyde Canterbury, MD  omeprazole (PRILOSEC) 40 MG capsule Take 1 capsule (40 mg total) by mouth daily. Patient not taking: Reported on 05/25/2019 12/03/16   Janith Lima, MD    prednisoLONE (ORAPRED) 15 MG/5ML solution 10cc PO BID x 3 days, then 5cc PO BID x 3 days,then 5cc PO QD x 3 days 06/02/19   Clyde Canterbury, MD  Probiotic Product (PROBIOTIC PO) Take by mouth.      [provider]  vitamin C (ASCORBIC ACID) 500 MG tablet Take 500 mg by mouth daily.    [provider]  Vitamin D, Ergocalciferol, (DRISDOL) 50000 UNITS CAPS capsule Take 1 capsule by mouth 2 (two) times a week. Emmie Niemann, Sat) 05/13/14   [provider]  venlafaxine (EFFEXOR-XR) 75 MG 24 hr capsule Take 75 mg by mouth daily.    10/12/11  [provider]    Allergies    Hydrocodone  Review of Systems   Review of Systems  Constitutional: Negative for chills, diaphoresis and fever.  HENT: Negative for congestion, ear pain and sore throat.   Respiratory: Negative for cough and shortness of breath.   Cardiovascular: Positive for chest pain. Negative for leg swelling.  Gastrointestinal: Negative for abdominal pain, diarrhea, nausea and vomiting.  Genitourinary: Negative for dysuria.  Neurological: Negative for dizziness, syncope, weakness and numbness.  All other systems reviewed and are negative.   Physical Exam Updated Vital Signs BP 115/78 (BP Location: Right Arm)   Pulse 82   Temp 98.1 F (36.7 C) (Oral)   Resp 16   Ht 5\' 5"  (1.651 m)   Wt 86.2 kg   LMP 06/29/2019   SpO2 100%   BMI 31.62 kg/m   Physical Exam Vitals and nursing note reviewed.  Constitutional:      General: She is not in acute distress.    Appearance: She is well-developed. She is not toxic-appearing.  HENT:     Head: Normocephalic and atraumatic.  Eyes:     General:        Right eye: No discharge.        Left eye: No discharge.     Conjunctiva/sclera: Conjunctivae normal.  Cardiovascular:     Rate and Rhythm: Normal rate and regular rhythm.     Pulses:          Radial pulses are 2+ on the right side and 2+ on the left side.       Posterior tibial pulses are 2+ on the right  side and 2+ on the left side.  Pulmonary:     Effort: Pulmonary effort is normal. No respiratory distress.     Breath sounds: Normal breath sounds. No wheezing, rhonchi or rales.  Chest:     Chest wall: No tenderness or crepitus.  Abdominal:  General: There is no distension.     Palpations: Abdomen is soft.     Tenderness: There is no abdominal tenderness.  Musculoskeletal:     Cervical back: Neck supple.     Right lower leg: No tenderness. No edema.     Left lower leg: No tenderness. No edema.  Skin:    General: Skin is warm and dry.     Findings: No rash.  Neurological:     Mental Status: She is alert.     Comments: Clear speech.   Psychiatric:        Behavior: Behavior normal.     ED Results / Procedures / Treatments   Labs (all labs ordered are listed, but only abnormal results are displayed) Labs Reviewed  BASIC METABOLIC PANEL - Abnormal; Notable for the following components:      Result Value   Calcium 8.6 (*)    All other components within normal limits  CBC WITH DIFFERENTIAL/PLATELET  TROPONIN I (HIGH SENSITIVITY)  TROPONIN I (HIGH SENSITIVITY)    EKG EKG Interpretation  Date/Time:  Thursday July 16 2019 14:14:02 EST Ventricular Rate:  77 PR Interval:  162 QRS Duration: 86 QT Interval:  380 QTC Calculation: 430 R Axis:   69 Text Interpretation: Normal sinus rhythm with sinus arrhythmia Right atrial enlargement Borderline ECG No significant change was found Confirmed by Gerlene Fee [phone]) on 07/16/2019 3:20:01 PM   Radiology DG Chest Port 1 View  Result Date: 07/16/2019 CLINICAL DATA:  38 year old female with a history of chest pain EXAM: PORTABLE CHEST 1 VIEW COMPARISON:  07/15/2013 FINDINGS: The heart size and mediastinal contours are within normal limits. Both lungs are clear. The visualized skeletal structures are unremarkable. IMPRESSION: Negative for acute cardiopulmonary disease Electronically Signed   By: Corrie Mckusick D.O.   On:  07/16/2019 15:08    Procedures Procedures (including critical care time)  Medications Ordered in ED Medications - No data to display  ED Course  I have reviewed the triage vital signs and the nursing notes.  Pertinent labs & imaging results that were available during my care of the patient were reviewed by me and considered in my medical decision making (see chart for details).    MDM Rules/Calculators/A&P                      Patient presents to the emergency department with intermittent chest pain. Patient nontoxic appearing, in no apparent distress, vitals without significant abnormality. Fairly benign physical exam. DDX: ACS, pulmonary embolism, dissection, pneumothorax, effusion, pneumonia, arrhythmia, anemia, electrolyte derangement, MSK, GERD, anxiety. Evaluation initiated with labs, EKG, and CXR. Patient on cardiac monitor.   Work-up in the ER reviewed:  CBC: No leukocytosis or anemia. BMP: Mild hypocalcemia no significant electrolyte derangement.. Troponin: < 2, < 2 EKG: No significant change from prior. CXR: Negative, without infiltrate, effusion, pneumothorax, or fracture/dislocation.   Low risk risk heart pathway score, EKG without obvious acute ischemia, delta troponin negative, doubt ACS. Patient is low risk wells, PERC negative, doubt pulmonary embolism. Pain is not a tearing sensation, symmetric pulses, no widening of mediastinum on CXR, doubt dissection. Cardiac monitor reviewed, no notable arrhythmias or tachycardia. Patient has appeared hemodynamically stable throughout ER visit and appears safe for discharge with close PCP/cardiology follow up.  Unclear definitive etiology, given some increased irritation and yoga will trial NSAIDs and muscle relaxer.  I discussed results, treatment plan, need for follow-up, and return precautions with the patient. Provided opportunity for questions,  patient confirmed understanding and is in agreement with plan.   Final Clinical  Impression(s) / ED Diagnoses Final diagnoses:  Chest pain, unspecified type    Rx / DC Orders ED Discharge Orders         Ordered    naproxen (NAPROSYN) 500 MG tablet  2 times daily     07/16/19 1856    methocarbamol (ROBAXIN) 500 MG tablet  Every 8 hours PRN     07/16/19 1856           Amaryllis Dyke, PA-C 07/16/19 1902    Maudie Flakes, MD 07/20/19 2006

## 2019-11-19 ENCOUNTER — Ambulatory Visit: Payer: BC Managed Care – PPO | Admitting: Internal Medicine

## 2019-11-19 ENCOUNTER — Encounter: Payer: Self-pay | Admitting: Internal Medicine

## 2019-11-19 ENCOUNTER — Other Ambulatory Visit: Payer: Self-pay

## 2019-11-19 VITALS — BP 120/78 | HR 69 | Temp 98.1°F | Ht 65.0 in | Wt 195.0 lb

## 2019-11-19 DIAGNOSIS — E538 Deficiency of other specified B group vitamins: Secondary | ICD-10-CM | POA: Diagnosis not present

## 2019-11-19 DIAGNOSIS — G63 Polyneuropathy in diseases classified elsewhere: Secondary | ICD-10-CM | POA: Diagnosis not present

## 2019-11-19 DIAGNOSIS — R5382 Chronic fatigue, unspecified: Secondary | ICD-10-CM

## 2019-11-19 DIAGNOSIS — Z0000 Encounter for general adult medical examination without abnormal findings: Secondary | ICD-10-CM

## 2019-11-19 DIAGNOSIS — R432 Parageusia: Secondary | ICD-10-CM | POA: Insufficient documentation

## 2019-11-19 DIAGNOSIS — Z1231 Encounter for screening mammogram for malignant neoplasm of breast: Secondary | ICD-10-CM

## 2019-11-19 NOTE — Patient Instructions (Signed)
Health Maintenance, Female Adopting a healthy lifestyle and getting preventive care are important in promoting health and wellness. Ask your health care provider about:  The right schedule for you to have regular tests and exams.  Things you can do on your own to prevent diseases and keep yourself healthy. What should I know about diet, weight, and exercise? Eat a healthy diet   Eat a diet that includes plenty of vegetables, fruits, low-fat dairy products, and lean protein.  Do not eat a lot of foods that are high in solid fats, added sugars, or sodium. Maintain a healthy weight Body mass index (BMI) is used to identify weight problems. It estimates body fat based on height and weight. Your health care provider can help determine your BMI and help you achieve or maintain a healthy weight. Get regular exercise Get regular exercise. This is one of the most important things you can do for your health. Most adults should:  Exercise for at least 150 minutes each week. The exercise should increase your heart rate and make you sweat (moderate-intensity exercise).  Do strengthening exercises at least twice a week. This is in addition to the moderate-intensity exercise.  Spend less time sitting. Even light physical activity can be beneficial. Watch cholesterol and blood lipids Have your blood tested for lipids and cholesterol at 41 years of age, then have this test every 5 years. Have your cholesterol levels checked more often if:  Your lipid or cholesterol levels are high.  You are older than 40 years of age.  You are at high risk for heart disease. What should I know about cancer screening? Depending on your health history and family history, you may need to have cancer screening at various ages. This may include screening for:  Breast cancer.  Cervical cancer.  Colorectal cancer.  Skin cancer.  Lung cancer. What should I know about heart disease, diabetes, and high blood  pressure? Blood pressure and heart disease  High blood pressure causes heart disease and increases the risk of stroke. This is more likely to develop in people who have high blood pressure readings, are of African descent, or are overweight.  Have your blood pressure checked: ? Every 3-5 years if you are 18-39 years of age. ? Every year if you are 40 years old or older. Diabetes Have regular diabetes screenings. This checks your fasting blood sugar level. Have the screening done:  Once every three years after age 40 if you are at a normal weight and have a low risk for diabetes.  More often and at a younger age if you are overweight or have a high risk for diabetes. What should I know about preventing infection? Hepatitis B If you have a higher risk for hepatitis B, you should be screened for this virus. Talk with your health care provider to find out if you are at risk for hepatitis B infection. Hepatitis C Testing is recommended for:  Everyone born from 1945 through 1965.  Anyone with known risk factors for hepatitis C. Sexually transmitted infections (STIs)  Get screened for STIs, including gonorrhea and chlamydia, if: ? You are sexually active and are younger than 41 years of age. ? You are older than 41 years of age and your health care provider tells you that you are at risk for this type of infection. ? Your sexual activity has changed since you were last screened, and you are at increased risk for chlamydia or gonorrhea. Ask your health care provider if   you are at risk.  Ask your health care provider about whether you are at high risk for HIV. Your health care provider may recommend a prescription medicine to help prevent HIV infection. If you choose to take medicine to prevent HIV, you should first get tested for HIV. You should then be tested every 3 months for as long as you are taking the medicine. Pregnancy  If you are about to stop having your period (premenopausal) and  you may become pregnant, seek counseling before you get pregnant.  Take 400 to 800 micrograms (mcg) of folic acid every day if you become pregnant.  Ask for birth control (contraception) if you want to prevent pregnancy. Osteoporosis and menopause Osteoporosis is a disease in which the bones lose minerals and strength with aging. This can result in bone fractures. If you are 65 years old or older, or if you are at risk for osteoporosis and fractures, ask your health care provider if you should:  Be screened for bone loss.  Take a calcium or vitamin D supplement to lower your risk of fractures.  Be given hormone replacement therapy (HRT) to treat symptoms of menopause. Follow these instructions at home: Lifestyle  Do not use any products that contain nicotine or tobacco, such as cigarettes, e-cigarettes, and chewing tobacco. If you need help quitting, ask your health care provider.  Do not use street drugs.  Do not share needles.  Ask your health care provider for help if you need support or information about quitting drugs. Alcohol use  Do not drink alcohol if: ? Your health care provider tells you not to drink. ? You are pregnant, may be pregnant, or are planning to become pregnant.  If you drink alcohol: ? Limit how much you use to 0-1 drink a day. ? Limit intake if you are breastfeeding.  Be aware of how much alcohol is in your drink. In the [place] one drink equals one 12 oz bottle of beer (355 mL), one 5 oz glass of wine (148 mL), or one 1 oz glass of hard liquor (44 mL). General instructions  Schedule regular health, dental, and eye exams.  Stay current with your vaccines.  Tell your health care provider if: ? You often feel depressed. ? You have ever been abused or do not feel safe at home. Summary  Adopting a healthy lifestyle and getting preventive care are important in promoting health and wellness.  Follow your health care provider's instructions about healthy  diet, exercising, and getting tested or screened for diseases.  Follow your health care provider's instructions on monitoring your cholesterol and blood pressure. This information is not intended to replace advice given to you by your health care provider. Make sure you discuss any questions you have with your health care provider. Document Revised: 07/09/2018 Document Reviewed: 07/09/2018 Elsevier Patient Education  2020 [employer] Inc.  

## 2019-11-19 NOTE — Progress Notes (Addendum)
Subjective:  Patient ID: Donna Aguilar, female    DOB: [birth year]  Age: 39 y.o. MRN: [id number]  CC: Annual Exam  This visit occurred during the SARS-CoV-2 public health emergency.  Safety protocols were in place, including screening questions prior to the visit, additional usage of staff PPE, and extensive cleaning of exam room while observing appropriate contact time as indicated for disinfecting solutions.    HPI Donna Aguilar presents for a CPX.  She has lost her sense of taste twice over the last 4 months.  The first episode occurred on 07/23/19 and then it occurred earlier this week again.  She complains of chronic fatigue.  She denies headache, blurred vision, sore throat, cough, fever, chills, night sweats, or rash.  She has a history of B12 deficiency and is taking a B12 complex orally.  Outpatient Medications Prior to Visit  Medication Sig Dispense Refill  . B Complex-C (B-COMPLEX WITH VITAMIN C) tablet Take 1 tablet by mouth daily.    . Probiotic Product (PROBIOTIC PO) Take by mouth.      . vitamin C (ASCORBIC ACID) 500 MG tablet Take 500 mg by mouth daily.    . Vitamin D, Ergocalciferol, (DRISDOL) 50000 UNITS CAPS capsule Take 1 capsule by mouth 2 (two) times a week. (Wed, Sat)    . HYDROcodone-acetaminophen (HYCET) 7.5-325 mg/15 ml solution 10-15cc every 4-6 hours as needed for pain 300 mL 0  . methocarbamol (ROBAXIN) 500 MG tablet Take 1 tablet (500 mg total) by mouth every 8 (eight) hours as needed. 15 tablet 0  . naproxen (NAPROSYN) 500 MG tablet Take 1 tablet (500 mg total) by mouth 2 (two) times daily. 10 tablet 0  . prednisoLONE (ORAPRED) 15 MG/5ML solution 10cc PO BID x 3 days, then 5cc PO BID x 3 days,then 5cc PO QD x 3 days 120 mL 0   No facility-administered medications prior to visit.    ROS Review of Systems  Constitutional: Positive for fatigue. Negative for appetite change, diaphoresis and unexpected weight change.  HENT: Negative.  Negative for sinus  pressure, sore throat and trouble swallowing.   Eyes: Negative for visual disturbance.  Respiratory: Negative for cough, chest tightness, shortness of breath and wheezing.   Cardiovascular: Negative for chest pain, palpitations and leg swelling.  Gastrointestinal: Negative for abdominal pain, constipation, diarrhea, nausea and vomiting.  Endocrine: Negative.   Genitourinary: Negative.  Negative for difficulty urinating.  Musculoskeletal: Negative.  Negative for arthralgias.  Skin: Negative.  Negative for color change and rash.  Neurological: Negative.  Negative for dizziness, weakness, light-headedness and headaches.  Hematological: Negative for adenopathy. Does not bruise/bleed easily.  Psychiatric/Behavioral: Negative.     Objective:  BP 120/78 (BP Location: Left Arm, Patient Position: Sitting, Cuff Size: Large)   Pulse 69   Temp 98.1 F (36.7 C) (Oral)   Ht 5\' 5"  (1.651 m)   Wt 195 lb (88.5 kg)   SpO2 98%   BMI 32.45 kg/m   BP Readings from Last 3 Encounters:  11/19/19 120/78  07/16/19 108/89  06/02/19 132/82    Wt Readings from Last 3 Encounters:  11/19/19 195 lb (88.5 kg)  07/16/19 190 lb (86.2 kg)  06/02/19 195 lb (88.5 kg)    Physical Exam Vitals reviewed.  Constitutional:      Appearance: Normal appearance.  HENT:     Head: Normocephalic.     Nose: Nose normal.     Mouth/Throat:     Lips: Pink. No lesions.  Mouth: Mucous membranes are moist.     Tongue: No lesions.     Palate: No mass.     Pharynx: No oropharyngeal exudate or posterior oropharyngeal erythema.     Tonsils: No tonsillar exudate.  Eyes:     General: No scleral icterus.    Conjunctiva/sclera: Conjunctivae normal.  Cardiovascular:     Rate and Rhythm: Normal rate and regular rhythm.     Heart sounds: No murmur.  Pulmonary:     Effort: Pulmonary effort is normal.     Breath sounds: No stridor. No wheezing, rhonchi or rales.  Abdominal:     General: Abdomen is flat. Bowel sounds are  normal. There is no distension.     Palpations: Abdomen is soft. There is no hepatomegaly, splenomegaly or mass.     Tenderness: There is no abdominal tenderness.  Musculoskeletal:        General: Normal range of motion.     Cervical back: Neck supple.     Right lower leg: No edema.     Left lower leg: No edema.  Lymphadenopathy:     Cervical: No cervical adenopathy.  Skin:    General: Skin is warm and dry.     Coloration: Skin is not pale.  Neurological:     General: No focal deficit present.     Mental Status: She is alert.  Psychiatric:        Mood and Affect: Mood normal.        Behavior: Behavior normal.     Lab Results  Component Value Date   WBC 6.7 11/19/2019   HGB 12.7 11/19/2019   HCT 38.7 11/19/2019   PLT 169.0 11/19/2019   GLUCOSE 84 11/19/2019   CHOL 200 11/19/2019   TRIG 103.0 11/19/2019   HDL 75.50 11/19/2019   LDLDIRECT 147.5 05/29/2011   LDLCALC 104 (H) 11/19/2019   ALT 13 12/03/2016   AST 17 12/03/2016   NA 138 11/19/2019   K 4.9 11/19/2019   CL 104 11/19/2019   CREATININE 0.95 11/19/2019   BUN 12 11/19/2019   CO2 29 11/19/2019   TSH 2.25 11/19/2019    DG Chest Port 1 View  Result Date: 07/16/2019 CLINICAL DATA:  39 year old female with a history of chest pain EXAM: PORTABLE CHEST 1 VIEW COMPARISON:  07/15/2013 FINDINGS: The heart size and mediastinal contours are within normal limits. Both lungs are clear. The visualized skeletal structures are unremarkable. IMPRESSION: Negative for acute cardiopulmonary disease Electronically Signed   By: Corrie Mckusick D.O.   On: 07/16/2019 15:08    Assessment & Plan:   Donna Aguilar was seen today for annual exam.  Diagnoses and all orders for this visit:  Vitamin B12 deficiency neuropathy ([place])- Her B12 level is in the low normal range.  Will continue the oral B12 supplement. -     CBC with Differential/Platelet; Future -     Vitamin B12; Future -     Folate; Future -     Folate -     Vitamin B12 -      CBC with Differential/Platelet  Routine general medical examination at a health care facility- Exam completed, labs reviewed, she refused all vaccines today, cervical cancer screening is up-to-date, she is referred for screening mammogram. -     Lipid panel; Future -     Lipid panel  Loss of taste- Testing for Covid antibodies is negative.  I do not have a good explanation for her sense of lost of taste.  If  it continues then we will consider referral to ENT. -     SARS-COV-2 IgG; Future -     SARS-COV-2 IgG  Chronic fatigue- Labs are negative for any secondary causes. -     Basic metabolic panel; Future -     TSH; Future -     TSH -     Basic metabolic panel   I have discontinued Donna Aguilar's prednisoLONE, HYDROcodone-acetaminophen, naproxen, and methocarbamol. I am also having her maintain her Probiotic Product (PROBIOTIC PO), Vitamin D (Ergocalciferol), B-complex with vitamin C, and vitamin C.  No orders of the defined types were placed in this encounter.    Follow-up: Return in about 6 months (around 05/20/2020).  Scarlette Calico, MD

## 2019-11-20 ENCOUNTER — Encounter: Payer: Self-pay | Admitting: Internal Medicine

## 2019-11-20 LAB — LIPID PANEL
Cholesterol: 200 mg/dL (ref 0–200)
HDL: 75.5 mg/dL (ref >39.00)
LDL Cholesterol: 104 mg/dL — ABNORMAL HIGH (ref 0–99)
NonHDL: 124.62
Total CHOL/HDL Ratio: 3
Triglycerides: 103 mg/dL (ref 0.0–149.0)
VLDL: 20.6 mg/dL (ref 0.0–40.0)

## 2019-11-20 LAB — CBC WITH DIFFERENTIAL/PLATELET
Basophils Absolute: 0.1 10*3/uL (ref 0.0–0.1)
Basophils Relative: 0.9 % (ref 0.0–3.0)
Eosinophils Absolute: 0 10*3/uL (ref 0.0–0.7)
Eosinophils Relative: 0.5 % (ref 0.0–5.0)
HCT: 38.7 % (ref 36.0–46.0)
Hemoglobin: 12.7 g/dL (ref 12.0–15.0)
Lymphocytes Relative: 52.7 % — ABNORMAL HIGH (ref 12.0–46.0)
Lymphs Abs: 3.5 10*3/uL (ref 0.7–4.0)
MCHC: 32.8 g/dL (ref 30.0–36.0)
MCV: 92.5 fl (ref 78.0–100.0)
Monocytes Absolute: 0.4 10*3/uL (ref 0.1–1.0)
Monocytes Relative: 6.3 % (ref 3.0–12.0)
Neutro Abs: 2.6 10*3/uL (ref 1.4–7.7)
Neutrophils Relative %: 39.6 % — ABNORMAL LOW (ref 43.0–77.0)
Platelets: 169 10*3/uL (ref 150.0–400.0)
RBC: 4.18 Mil/uL (ref 3.87–5.11)
RDW: 14.9 % (ref 11.5–15.5)
WBC: 6.7 10*3/uL (ref 4.0–10.5)

## 2019-11-20 LAB — BASIC METABOLIC PANEL
BUN: 12 mg/dL (ref 6–23)
CO2: 29 mEq/L (ref 19–32)
Calcium: 8.7 mg/dL (ref 8.4–10.5)
Chloride: 104 mEq/L (ref 96–112)
Creatinine, Ser: 0.95 mg/dL (ref 0.40–1.20)
GFR: 78.51 mL/min (ref >60.00)
Glucose, Bld: 84 mg/dL (ref 70–99)
Potassium: 4.9 mEq/L (ref 3.5–5.1)
Sodium: 138 mEq/L (ref 135–145)

## 2019-11-20 LAB — VITAMIN B12: Vitamin B-12: 250 pg/mL (ref 211–911)

## 2019-11-20 LAB — SARS-COV-2 IGG: SARS-COV-2 IgG: 0.02

## 2019-11-20 LAB — TSH: TSH: 2.25 u[IU]/mL (ref 0.35–4.50)

## 2019-11-20 LAB — FOLATE: Folate: 7.2 ng/mL (ref >5.9)

## 2019-11-21 DIAGNOSIS — R5382 Chronic fatigue, unspecified: Secondary | ICD-10-CM | POA: Insufficient documentation

## 2019-11-21 DIAGNOSIS — Z1231 Encounter for screening mammogram for malignant neoplasm of breast: Secondary | ICD-10-CM | POA: Insufficient documentation

## 2020-02-19 ENCOUNTER — Ambulatory Visit: Payer: BC Managed Care – PPO

## 2020-08-30 LAB — HM MAMMOGRAPHY

## 2020-08-30 LAB — HM PAP SMEAR

## 2021-07-12 ENCOUNTER — Encounter: Payer: Self-pay | Admitting: Internal Medicine

## 2021-07-12 ENCOUNTER — Ambulatory Visit (INDEPENDENT_AMBULATORY_CARE_PROVIDER_SITE_OTHER): Payer: BC Managed Care – PPO | Admitting: Internal Medicine

## 2021-07-12 ENCOUNTER — Other Ambulatory Visit: Payer: Self-pay

## 2021-07-12 VITALS — BP 114/70 | HR 68 | Temp 98.3°F | Resp 16 | Ht 65.0 in | Wt 186.0 lb

## 2021-07-12 DIAGNOSIS — G63 Polyneuropathy in diseases classified elsewhere: Secondary | ICD-10-CM | POA: Diagnosis not present

## 2021-07-12 DIAGNOSIS — E538 Deficiency of other specified B group vitamins: Secondary | ICD-10-CM

## 2021-07-12 DIAGNOSIS — Z0000 Encounter for general adult medical examination without abnormal findings: Secondary | ICD-10-CM

## 2021-07-12 DIAGNOSIS — Z23 Encounter for immunization: Secondary | ICD-10-CM | POA: Diagnosis not present

## 2021-07-12 DIAGNOSIS — Z1159 Encounter for screening for other viral diseases: Secondary | ICD-10-CM

## 2021-07-12 LAB — CBC WITH DIFFERENTIAL/PLATELET
Basophils Absolute: 0.1 10*3/uL (ref 0.0–0.1)
Basophils Relative: 1.3 % (ref 0.0–3.0)
Eosinophils Absolute: 0 10*3/uL (ref 0.0–0.7)
Eosinophils Relative: 0.8 % (ref 0.0–5.0)
HCT: 37.3 % (ref 36.0–46.0)
Hemoglobin: 12.2 g/dL (ref 12.0–15.0)
Lymphocytes Relative: 63.6 % — ABNORMAL HIGH (ref 12.0–46.0)
Lymphs Abs: 3.2 10*3/uL (ref 0.7–4.0)
MCHC: 32.5 g/dL (ref 30.0–36.0)
MCV: 91.2 fl (ref 78.0–100.0)
Monocytes Absolute: 0.2 10*3/uL (ref 0.1–1.0)
Monocytes Relative: 5 % (ref 3.0–12.0)
Neutro Abs: 1.5 10*3/uL (ref 1.4–7.7)
Neutrophils Relative %: 29.3 % — ABNORMAL LOW (ref 43.0–77.0)
Platelets: 201 10*3/uL (ref 150.0–400.0)
RBC: 4.09 Mil/uL (ref 3.87–5.11)
RDW: 14 % (ref 11.5–15.5)
WBC: 5 10*3/uL (ref 4.0–10.5)

## 2021-07-12 LAB — FOLATE: Folate: 5.3 ng/mL — ABNORMAL LOW (ref >5.9)

## 2021-07-12 LAB — VITAMIN B12: Vitamin B-12: 521 pg/mL (ref 211–911)

## 2021-07-12 MED ORDER — FOLIC ACID 1 MG PO TABS: 1.0000 mg | ORAL_TABLET | Freq: Every day | ORAL | 1 refills | Status: DC

## 2021-07-12 NOTE — Patient Instructions (Signed)

## 2021-07-12 NOTE — Progress Notes (Signed)
Subjective:  Patient ID: Donna Aguilar, female    DOB: [birth year]  Age: 40 y.o. MRN: [id number]  CC: Annual Exam  This visit occurred during the SARS-CoV-2 public health emergency.  Safety protocols were in place, including screening questions prior to the visit, additional usage of staff PPE, and extensive cleaning of exam room while observing appropriate contact time as indicated for disinfecting solutions.    HPI Donna Aguilar presents for a CPX and f/up -   She has felt well recently and offers no complaints. She is not taking a B12 supplement.  Outpatient Medications Prior to Visit  Medication Sig Dispense Refill   B Complex-C (B-COMPLEX WITH VITAMIN C) tablet Take 1 tablet by mouth daily.     Probiotic Product (PROBIOTIC PO) Take by mouth.       vitamin C (ASCORBIC ACID) 500 MG tablet Take 500 mg by mouth daily.     Vitamin D, Ergocalciferol, (DRISDOL) 50000 UNITS CAPS capsule Take 1 capsule by mouth 2 (two) times a week. (Wed, Sat)     No facility-administered medications prior to visit.    ROS Review of Systems  Constitutional:  Negative for diaphoresis and fatigue.  HENT: Negative.    Eyes: Negative.   Cardiovascular:  Negative for chest pain, palpitations and leg swelling.  Gastrointestinal:  Negative for abdominal pain, constipation, diarrhea, nausea and vomiting.  Endocrine: Negative.   Genitourinary: Negative.  Negative for difficulty urinating.  Musculoskeletal: Negative.  Negative for arthralgias and myalgias.  Skin: Negative.   Neurological:  Negative for dizziness, weakness, light-headedness and headaches.  Hematological:  Negative for adenopathy. Does not bruise/bleed easily.  Psychiatric/Behavioral: Negative.     Objective:  BP 114/70 (BP Location: Left Arm, Patient Position: Sitting, Cuff Size: Large)    Pulse 68    Temp 98.3 F (36.8 C) (Oral)    Ht 5\' 5"  (1.651 m)    Wt 186 lb (84.4 kg)    SpO2 99%    BMI 30.95 kg/m   BP Readings from Last 3  Encounters:  07/12/21 114/70  11/19/19 120/78  07/16/19 108/89    Wt Readings from Last 3 Encounters:  07/12/21 186 lb (84.4 kg)  11/19/19 195 lb (88.5 kg)  07/16/19 190 lb (86.2 kg)    Physical Exam  Lab Results  Component Value Date   WBC 5.0 07/12/2021   HGB 12.2 07/12/2021   HCT 37.3 07/12/2021   PLT 201.0 07/12/2021   GLUCOSE 84 11/19/2019   CHOL 200 11/19/2019   TRIG 103.0 11/19/2019   HDL 75.50 11/19/2019   LDLDIRECT 147.5 05/29/2011   LDLCALC 104 (H) 11/19/2019   ALT 13 12/03/2016   AST 17 12/03/2016   NA 138 11/19/2019   K 4.9 11/19/2019   CL 104 11/19/2019   CREATININE 0.95 11/19/2019   BUN 12 11/19/2019   CO2 29 11/19/2019   TSH 2.25 11/19/2019    DG Chest Port 1 View  Result Date: 07/16/2019 CLINICAL DATA:  40 year old female with a history of chest pain EXAM: PORTABLE CHEST 1 VIEW COMPARISON:  07/15/2013 FINDINGS: The heart size and mediastinal contours are within normal limits. Both lungs are clear. The visualized skeletal structures are unremarkable. IMPRESSION: Negative for acute cardiopulmonary disease Electronically Signed   By: Corrie Mckusick D.O.   On: 07/16/2019 15:08    Assessment & Plan:   Donna Aguilar was seen today for annual exam.  Diagnoses and all orders for this visit:  Vitamin B12 deficiency neuropathy ([place])- Her H/H/B12 are normal.  Her folate is low. -     CBC with Differential/Platelet; Future -     Vitamin B12; Future -     Folate; Future -     Folate -     Vitamin B12 -     CBC with Differential/Platelet  Routine general medical examination at a health care facility- Exam completed, labs reviewed, she refused a flu, tdap given, cancer screenings are UTD, pt ed material was given. -     Hepatitis C antibody; Future -     HIV Antibody (routine testing w rflx); Future -     HIV Antibody (routine testing w rflx) -     Hepatitis C antibody  Need for hepatitis C screening test -     Hepatitis C antibody; Future -     Hepatitis C  antibody  Need for Tdap vaccination -     Tdap vaccine greater than or equal to 7yo IM  Folate deficiency -     folic acid (FOLVITE) 1 MG tablet; Take 1 tablet (1 mg total) by mouth daily.  I am having Donna Aguilar start on folic acid. I am also having her maintain her Probiotic Product (PROBIOTIC PO), Vitamin D (Ergocalciferol), B-complex with vitamin C, and vitamin C.  Meds ordered this encounter  Medications   folic acid (FOLVITE) 1 MG tablet    Sig: Take 1 tablet (1 mg total) by mouth daily.    Dispense:  90 tablet    Refill:  1      Follow-up: Return in about 1 year (around 07/12/2022).  Scarlette Calico, MD

## 2021-07-13 ENCOUNTER — Encounter: Payer: Self-pay | Admitting: Internal Medicine

## 2021-07-13 LAB — HEPATITIS C ANTIBODY
Hepatitis C Ab: NONREACTIVE
SIGNAL TO CUT-OFF: <0.02 (ref <1.00)

## 2021-07-13 LAB — HIV ANTIBODY (ROUTINE TESTING W REFLEX): HIV 1&2 Ab, 4th Generation: NONREACTIVE

## 2021-10-03 ENCOUNTER — Telehealth: Payer: BC Managed Care – PPO | Admitting: Physician Assistant

## 2021-10-03 DIAGNOSIS — R079 Chest pain, unspecified: Secondary | ICD-10-CM

## 2021-10-03 DIAGNOSIS — R0602 Shortness of breath: Secondary | ICD-10-CM

## 2021-10-03 NOTE — Addendum Note (Signed)
Addended by: Mar Daring on: 10/03/2021 09:12 AM ? ? Modules accepted: Level of Service ? ?

## 2021-10-03 NOTE — Progress Notes (Signed)
?Virtual Visit Consent  ? ?Donna Aguilar, you are scheduled for a virtual visit with a [facility] provider today.   ?  ?Just as with appointments in the office, your consent must be obtained to participate.  Your consent will be active for this visit and any virtual visit you may have with one of our providers in the next 365 days.   ?  ?If you have a MyChart account, a copy of this consent can be sent to you electronically.  All virtual visits are billed to your insurance company just like a traditional visit in the office.   ? ?As this is a virtual visit, video technology does not allow for your provider to perform a traditional examination.  This may limit your provider's ability to fully assess your condition.  If your provider identifies any concerns that need to be evaluated in person or the need to arrange testing (such as labs, EKG, etc.), we will make arrangements to do so.   ?  ?Although advances in technology are sophisticated, we cannot ensure that it will always work on either your end or our end.  If the connection with a video visit is poor, the visit may have to be switched to a telephone visit.  With either a video or telephone visit, we are not always able to ensure that we have a secure connection.    ? ?I need to obtain your verbal consent now.   Are you willing to proceed with your visit today?  ?  ?Donna Aguilar has provided verbal consent on 10/03/2021 for a virtual visit (video or telephone). ?  ?Donna Daring, Donna Aguilar  ? ?Date: 10/03/2021 9:06 AM ? ? ?Virtual Visit via Video Note  ? ?Reynolds Bowl, connected with  Donna Aguilar  ([id number], 07-06-1981) on 10/03/21 at  8:45 AM EST by a video-enabled telemedicine application and verified that I am speaking with the correct person using two identifiers. ? ?Location: ?Patient: Virtual Visit Location Patient: Mobile ?Provider: Virtual Visit Location Provider: Home Office ?  ?I discussed the limitations of evaluation and management  by telemedicine and the availability of in person appointments. The patient expressed understanding and agreed to proceed.   ? ?History of Present Illness: ?Donna Aguilar is a 41 y.o. who identifies as a female who was assigned female at birth, and is being seen today for right side chest pain. ? ?HPI: Chest Pain  ?This is a new problem. Episode onset: started in the right side of the neck and has moved to the right side of the chest; started on Sep 18, 2021. The onset quality is sudden. The problem occurs intermittently. The problem has been waxing and waning. The pain is present in the lateral region (right). The quality of the pain is described as heavy (throbbing; lessens throughout the day). The pain radiates to the right shoulder, mid back and right neck. Associated symptoms include shortness of breath. Pertinent negatives include no cough, fever, orthopnea, palpitations, PND or sputum production. Associated symptoms comments: Had sore throat on 09/18/21. The pain is aggravated by breathing and deep breathing. She has tried NSAIDs for the symptoms. The treatment provided no relief.   ? ? ?Problems:  ?Patient Active Problem List  ? Diagnosis Date Noted  ? Need for hepatitis C screening test 07/12/2021  ? Folate deficiency 07/12/2021  ? Chronic fatigue 11/21/2019  ? Visit for screening mammogram 11/21/2019  ? 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 Vitamin B12  deficiency neuropathy ([place]) 10/25/2014  ? Obesity 05/30/2011  ? Routine general medical examination at a health care facility 05/29/2011  ? GERD 09/22/2010  ?  ?Allergies:  ?Allergies  ?Allergen Reactions  ? Hydrocodone   ?  Light-headedness  ? ?Medications:  ?Current Outpatient Medications:  ?  B Complex-C (B-COMPLEX WITH VITAMIN C) tablet, Take 1 tablet by mouth daily., Disp: , Rfl:  ?  folic acid (FOLVITE) 1 MG tablet, Take 1 tablet (1 mg total) by mouth daily., Disp: 90 tablet, Rfl: 1 ?  Probiotic Product (PROBIOTIC PO),  Take by mouth.  , Disp: , Rfl:  ?  vitamin C (ASCORBIC ACID) 500 MG tablet, Take 500 mg by mouth daily., Disp: , Rfl:  ?  Vitamin D, Ergocalciferol, (DRISDOL) 50000 UNITS CAPS capsule, Take 1 capsule by mouth 2 (two) times a week. (Wed, Sat), Disp: , Rfl:  ? ?Observations/Objective: ?Patient is well-developed, well-nourished in no acute distress.  ?Resting comfortably  ?Head is normocephalic, atraumatic.  ?No labored breathing.  ?Speech is clear and coherent with logical content.  ?Patient is alert and oriented at baseline.  ? ? ?Assessment and Plan: ?1. Right-sided chest pain ? ?2. SOB (shortness of breath) ? ?- DDx: Muscle strain, GERD, cardiac, costochondritis, pneumonia, pleurisy, atypical gallbladder presentation ?- Symptoms present since 09/18/21, started in right side of neck and now in right side of chest with associated SOB ?- Did have a sore throat at onset, no other URI symptoms noted in beginning ?- Unfortunately, unable to truly evaluate safely through Video Visit ?- Would benefit from further evaluation including hands on exam, vital signs, labs, and other additional testing as deemed necessary (I.e. EKG, CXR, possible gallbladder evaluation) ?- Advised to seek in person evaluation, patient agrees ? ?Follow Up Instructions: ?I discussed the assessment and treatment plan with the patient. The patient was provided an opportunity to ask questions and all were answered. The patient agreed with the plan and demonstrated an understanding of the instructions.  A copy of instructions were sent to the patient via MyChart unless otherwise noted below.  ? ?The patient was advised to call back or seek an in-person evaluation if the symptoms worsen or if the condition fails to improve as anticipated. ? ?Time:  ?I spent 17 minutes with the patient via telehealth technology discussing the above problems/concerns.   ? ?Donna Daring, Donna Aguilar ?

## 2021-10-03 NOTE — Patient Instructions (Signed)
?  Donna Aguilar, thank you for joining Donna Daring, PA-C for today's virtual visit.  While this provider is not your primary care provider (PCP), if your PCP is located in our provider database this encounter information will be shared with them immediately following your visit. ? ?Consent: ?(Patient) Donna Aguilar provided verbal consent for this virtual visit at the beginning of the encounter. ? ?Current Medications: ? ?Current Outpatient Medications:  ?  B Complex-C (B-COMPLEX WITH VITAMIN C) tablet, Take 1 tablet by mouth daily., Disp: , Rfl:  ?  folic acid (FOLVITE) 1 MG tablet, Take 1 tablet (1 mg total) by mouth daily., Disp: 90 tablet, Rfl: 1 ?  Probiotic Product (PROBIOTIC PO), Take by mouth.  , Disp: , Rfl:  ?  vitamin C (ASCORBIC ACID) 500 MG tablet, Take 500 mg by mouth daily., Disp: , Rfl:  ?  Vitamin D, Ergocalciferol, (DRISDOL) 50000 UNITS CAPS capsule, Take 1 capsule by mouth 2 (two) times a week. (Wed, Sat), Disp: , Rfl:   ? ?Medications ordered in this encounter:  ?No orders of the defined types were placed in this encounter. ?  ? ?*If you need refills on other medications prior to your next appointment, please contact your pharmacy* ? ?Follow-Up: ?Call back or seek an in-person evaluation if the symptoms worsen or if the condition fails to improve as anticipated. ? ?Other Instructions ?Based on what you shared with me, I feel your condition warrants further evaluation and I recommend that you be seen in a face to face visit. ? ?[facility] Emergency Department at [facility]  ?Get Driving Directions  ?[address]  ?[place], [place] [address]  ?Open 24/7/365  ? ? ? For an urgent face to face visit, [facility] has six urgent care centers for your convenience:  ?  ? [facility] at [facility] ?Get Driving Directions ?[phone] ?[address] ?[place], [place] [address] ?  ? [facility]) ?Get Driving  Directions ?[phone] ?[address] ?[place], [place] [address] ? ?[facility] [place] ([place]) ?Get Driving Directions ?[address] [place],  [place]  [address] ? ?[facility] at [facility] ?Get Driving Directions ?[phone] ?[address] ?[place], [place] [address] ?  ?[facility] ?Get Driving Directions  ?[phone] ?[address] ?[address] ?[place], [place] [address] ?  ?[facility] at [facility] ?Get Driving Directions ?[phone] ?[address] ?[place], [place] [address] ? ? ?If you have been instructed to have an in-person evaluation today at a local Urgent Care facility, please use the link below. It will take you to a list of all of our available [facility], including address, phone number and hours of operation. Please do not delay care.  ?[facility] ? ?If you or a family member do not have a primary care provider, use the link below to schedule a visit and establish care. When you choose a [facility] primary care physician or advanced practice provider, you gain a long-term partner in health. ?Find a Primary Care Provider ? ?Learn more about 's in-office and virtual care options: ?[facility] Now ?

## 2021-11-29 ENCOUNTER — Other Ambulatory Visit (HOSPITAL_COMMUNITY)
Admission: RE | Admit: 2021-11-29 | Discharge: 2021-11-29 | Disposition: A | Discharge status: Home / Self Care | Payer: BC Managed Care – PPO | Source: Ambulatory Visit | Attending: Radiology | Admitting: Radiology

## 2021-11-29 DIAGNOSIS — Z01419 Encounter for gynecological examination (general) (routine) without abnormal findings: Secondary | ICD-10-CM | POA: Insufficient documentation

## 2021-12-08 ENCOUNTER — Encounter: Payer: Self-pay | Admitting: Radiology

## 2021-12-08 ENCOUNTER — Ambulatory Visit: Payer: BC Managed Care – PPO | Admitting: Radiology

## 2021-12-08 VITALS — BP 124/70 | HR 90 | Ht 65.0 in | Wt 193.0 lb

## 2021-12-08 DIAGNOSIS — N946 Dysmenorrhea, unspecified: Secondary | ICD-10-CM | POA: Diagnosis not present

## 2021-12-08 DIAGNOSIS — D500 Iron deficiency anemia secondary to blood loss (chronic): Secondary | ICD-10-CM

## 2021-12-08 DIAGNOSIS — D219 Benign neoplasm of connective and other soft tissue, unspecified: Secondary | ICD-10-CM

## 2021-12-08 MED ORDER — IBUPROFEN 800 MG PO TABS: 800.0000 mg | ORAL_TABLET | Freq: Three times a day (TID) | ORAL | 1 refills | Status: DC | PRN

## 2021-12-08 MED ORDER — MYFEMBREE 40-1-0.5 MG PO TABS
1.0000 | ORAL_TABLET | Freq: Every day | ORAL | 3 refills | Status: DC
Start: 2021-12-08 — End: 2024-05-05

## 2021-12-08 MED ORDER — ACCRUFER 30 MG PO CAPS: 1.0000 | ORAL_CAPSULE | Freq: Two times a day (BID) | ORAL | 3 refills | Status: DC

## 2021-12-08 NOTE — Progress Notes (Signed)
? ? ? ? ?  Donna Aguilar is 41 y.o. female presenting with complaint of pelvic pain, known fibroids. Heavy cycles, using ultra tampons, changing every couple hours with severe pain. Anemia diagnosed by PCP, pt unable to tolerate iron tablets. Has nausea and constipation. ? ? ?Subjective: ?Location of Pain: pelvic  ?Does pain radiate? To back ? ? ?LMP: 11/17/21 Periods are regular. Dysmenorrhea yes. Dyspareunia no.  ?Sexually active: yes  ?Method of Contraception vasectomy ?Prior pregnancies G3P1 ?Nausea/vomiting yes.  ?Fever no ?History of abdominal/pelvic surgeries no ?History of abnormal PAP yes  ? ? ?Objective:  ?Physical Exam ?Constitutional:   ?   Appearance: Normal appearance.  ?HENT:  ?   Head: Normocephalic.  ?Eyes:  ?   Pupils: Pupils are equal, round, and reactive to light.  ?Cardiovascular:  ?   Rate and Rhythm: Normal rate.  ?Pulmonary:  ?   Effort: Pulmonary effort is normal.  ?Abdominal:  ?   General: Abdomen is flat. Bowel sounds are normal.  ?   Palpations: Abdomen is soft.  ?Musculoskeletal:     ?   General: Normal range of motion.  ?Neurological:  ?   Mental Status: She is alert.  ?Skin: ?   General: Skin is warm.  ?Psychiatric:     ?   Mood and Affect: Mood normal.     ?   Thought Content: Thought content normal.     ?   Judgment: Judgment normal.  ?Vitals reviewed.  ?Pelvic exam: VULVA: normal appearing vulva with no masses, tenderness or lesions, VAGINA: normal appearing vagina with normal color and discharge, no lesions, CERVIX: normal appearing cervix without discharge or lesions, UTERUS: enlarged to 14 week's size, ADNEXA: normal adnexa in size, nontender and no masses. ?Chaperone offered and declined for exam. ? ?Assessment/Plan: ?1. Fibroids ? ?- US Transvaginal Non-OB; Future ? ?2. Iron deficiency anemia due to chronic blood loss ?Will try Accrufer as patient unable to tolerate ferrous sulfate ?- Ferric Maltol (ACCRUFER) 30 MG CAPS; Take 1 tablet by mouth 2 (two) times daily before a meal.   Dispense: 60 capsule; Refill: 3 ? ?3. Dysmenorrhea ? ?- ibuprofen (ADVIL) 800 MG tablet; Take 1 tablet (800 mg total) by mouth every 8 (eight) hours as needed.  Dispense: 90 tablet; Refill: 1  ?  ?

## 2021-12-11 ENCOUNTER — Telehealth: Payer: Self-pay | Admitting: *Deleted

## 2021-12-11 NOTE — Telephone Encounter (Signed)
PA form faxed to office from [facility] for Accrfuer 30 mg capsule.  Form will be signed by Wende Crease and faxed back to [facility].  ?

## 2021-12-12 NOTE — Telephone Encounter (Signed)
PA form faxed to [place]. Pending response.  ?

## 2021-12-22 NOTE — Telephone Encounter (Signed)
[facility] PA called re: information for this pt's PA request. Left PA # of [phone] and a return # of [phone].

## 2021-12-26 ENCOUNTER — Encounter: Payer: Self-pay | Admitting: Radiology

## 2021-12-26 ENCOUNTER — Ambulatory Visit (INDEPENDENT_AMBULATORY_CARE_PROVIDER_SITE_OTHER): Payer: BC Managed Care – PPO | Admitting: Radiology

## 2021-12-26 VITALS — BP 124/82 | Ht 65.0 in | Wt 203.0 lb

## 2021-12-26 DIAGNOSIS — Z01419 Encounter for gynecological examination (general) (routine) without abnormal findings: Secondary | ICD-10-CM | POA: Diagnosis present

## 2021-12-26 NOTE — Progress Notes (Signed)
   Donna Aguilar October 02, 1980 [id number]   History:  41 y.o. G3P1 presents for annual exam.Has appt scheduled on 6/27 for u/s eval of fibroids. No new gyn concerns.  Gynecologic History Patient's last menstrual period was 12/16/2021 (exact date). Period Cycle (Days): 28 Period Duration (Days): 4 Period Pattern: Regular Menstrual Flow:  (varies from heavy to light) Dysmenorrhea: (!) Severe Dysmenorrhea Symptoms: Cramping Contraception/Family planning: vasectomy Sexually active: yes, would like STI screen with pap Last Pap: 2022. Results were: abnormal Last mammogram: 2022. Results were: normal  Obstetric History OB History  Gravida Para Term Preterm AB Living  '3 1 1   2 1  '$ SAB IAB Ectopic Multiple Live Births          1    # Outcome Date GA Lbr Len/2nd Weight Sex Delivery Anes PTL Lv  3 Term           2 AB           1 AB              The following portions of the patient's history were reviewed and updated as appropriate: allergies, current medications, past family history, past medical history, past social history, past surgical history, and problem list.  Review of Systems Pertinent items noted in HPI and remainder of comprehensive ROS otherwise negative.   Past medical history, past surgical history, family history and social history were all reviewed and documented in the EPIC chart.   Exam:  Vitals:   12/26/21 1505  BP: 124/82  Weight: 203 lb (92.1 kg)  Height: '5\' 5"'$  (1.651 m)   Body mass index is 33.78 kg/m.  General appearance:  Normal Thyroid:  Symmetrical, normal in size, without palpable masses or nodularity. Respiratory  Auscultation:  Clear without wheezing or rhonchi Cardiovascular  Auscultation:  Regular rate, without rubs, murmurs or gallops  Edema/varicosities:  Not grossly evident Abdominal  Soft,nontender, without masses, guarding or rebound.  Liver/spleen:  No organomegaly noted  Hernia:  None appreciated  Skin  Inspection:  Grossly  normal Breasts: Examined lying and sitting.   Right: Without masses, retractions, nipple discharge or axillary adenopathy.   Left: Without masses, retractions, nipple discharge or axillary adenopathy. Genitourinary   Inguinal/mons:  Normal without inguinal adenopathy  External genitalia:  Normal appearing vulva with no masses, tenderness, or lesions  BUS/Urethra/Skene's glands:  Normal without masses or exudate  Vagina:  Normal appearing with normal color and discharge, no lesions  Cervix:  Normal appearing without discharge or lesions  Uterus:  Normal in size, shape and contour.  Mobile, nontender  Adnexa/parametria:     Rt: Normal in size, without masses or tenderness.   Lt: Normal in size, without masses or tenderness.  Anus and perineum: Normal   Patient informed chaperone available to be present for breast and pelvic exam. Patient has requested no chaperone to be present. Patient has been advised what will be completed during breast and pelvic exam.   Assessment/Plan:   1. Well woman exam with routine gynecological exam Pap with STI screen - Cytology - PAP( Donna Aguilar) - schedule screening mammogram     Discussed SBE, colonoscopy and DEXA screening as directed/appropriate. Recommend 169mns of exercise weekly, including weight bearing exercise. Encouraged the use of seatbelts and sunscreen. Return in 1 year for annual or as needed.   CRubbie BattiestB WHNP-BC 3:32 PM 12/26/2021

## 2021-12-26 NOTE — Telephone Encounter (Signed)
Medication approved effective 12/22/21-12/23/22

## 2021-12-28 LAB — CYTOLOGY - PAP
Comment: NEGATIVE
Diagnosis: NEGATIVE
High risk HPV: NEGATIVE

## 2022-01-23 ENCOUNTER — Encounter: Payer: Self-pay | Admitting: Radiology

## 2022-01-23 ENCOUNTER — Ambulatory Visit (INDEPENDENT_AMBULATORY_CARE_PROVIDER_SITE_OTHER): Payer: BC Managed Care – PPO

## 2022-01-23 ENCOUNTER — Ambulatory Visit (INDEPENDENT_AMBULATORY_CARE_PROVIDER_SITE_OTHER): Payer: BC Managed Care – PPO | Admitting: Radiology

## 2022-01-23 VITALS — BP 118/84

## 2022-01-23 DIAGNOSIS — N920 Excessive and frequent menstruation with regular cycle: Secondary | ICD-10-CM | POA: Diagnosis not present

## 2022-01-23 DIAGNOSIS — D219 Benign neoplasm of connective and other soft tissue, unspecified: Secondary | ICD-10-CM

## 2022-01-23 NOTE — Progress Notes (Signed)
.    Donna Aguilar Mar 21, 1981 [id number]   History:  41 y.o. G3P1 presents for follow up u/s to assess heavy menstrual bleeding, pelvic pain.   Gynecologic History No LMP recorded.   Contraception/Family planning: vasectomy  Last Pap:12/26/21 normal Last mammogram: 2022. Results were: normal  Obstetric History OB History  Gravida Para Term Preterm AB Living  3 1 1   2 1   SAB IAB Ectopic Multiple Live Births          1    # Outcome Date GA Lbr Len/2nd Weight Sex Delivery Anes PTL Lv  3 Term           2 AB           1 AB              The following portions of the patient's history were reviewed and updated as appropriate: allergies, current medications, past family history, past medical history, past social history, past surgical history, and problem list.  Review of Systems Pertinent items noted in HPI and remainder of comprehensive ROS otherwise negative.   Past medical history, past surgical history, family history and social history were all reviewed and documented in the EPIC chart.  Vitals:   01/23/22 1405  BP: 118/84   Ultrasound 01/23/22 Indication: Menorrhagia, dysmenorrhea  Vaginal scan:  Enlarged, anteverted uterus 11.42cmx9.27cmx7.13cm with a volume of 395.216cm3  Endometrial thickness 3.73mm  Endometrial cavity greatly distorted by adjacent fibroids, no obvious masses, difficult to evaluate  Average diameter of fibroids  Fibroid 1  2.92cm  Fibroid 2 1.91cm Fibroid 3 3.40cm Fibroid 4  1.11cm Fibroid 5  1.59cm Fibroid 6  2.41cm Fibroid 7  1.16cm Fibroid 8  4.44cm Fibroid 9  3.17cm Fibroid 10 2.83cm    Both ovaries normal size with normal follicle pattern  No adnexal masses or free fluid    Impression: Uterine Fibroids  Assessment/Plan:   1. Menorrhagia with regular cycle Associated Anemia- Call [facility] for accrufer prescription fill Will check levels in 3 months  2. Fibroids Multiple fibroids with associated heavy menstrual  bleeding Spoke with patient in detail regarding treatment and goals. Would like to avoid surgery if possible. Will start Myfembree daily with the hope of significant decreased in menstrual blood loss by the time of follow up visit in 3 months     Ailyne Pawley B WHNP-BC 2:32 PM 01/23/2022

## 2022-04-26 ENCOUNTER — Other Ambulatory Visit: Payer: BC Managed Care – PPO

## 2022-04-26 ENCOUNTER — Ambulatory Visit: Payer: BC Managed Care – PPO | Admitting: Radiology

## 2022-04-26 ENCOUNTER — Other Ambulatory Visit: Payer: BC Managed Care – PPO | Admitting: Radiology

## 2022-06-29 ENCOUNTER — Ambulatory Visit
Admission: RE | Admit: 2022-06-29 | Discharge: 2022-06-29 | Disposition: A | Discharge status: Home / Self Care | Payer: BC Managed Care – PPO | Attending: Family | Admitting: Family

## 2022-06-29 ENCOUNTER — Ambulatory Visit
Admission: RE | Admit: 2022-06-29 | Discharge: 2022-06-29 | Disposition: A | Discharge status: Home / Self Care | Payer: BC Managed Care – PPO | Source: Ambulatory Visit | Attending: Family | Admitting: Family

## 2022-06-29 ENCOUNTER — Other Ambulatory Visit: Payer: Self-pay | Admitting: Family

## 2022-06-29 DIAGNOSIS — R0789 Other chest pain: Secondary | ICD-10-CM

## 2022-07-10 ENCOUNTER — Other Ambulatory Visit: Payer: Self-pay | Admitting: Otolaryngology

## 2022-07-10 DIAGNOSIS — G8929 Other chronic pain: Secondary | ICD-10-CM

## 2022-07-26 ENCOUNTER — Ambulatory Visit
Admission: RE | Admit: 2022-07-26 | Discharge: 2022-07-26 | Disposition: A | Discharge status: Home / Self Care | Payer: BC Managed Care – PPO | Source: Ambulatory Visit | Attending: Otolaryngology | Admitting: Otolaryngology

## 2022-07-26 DIAGNOSIS — G8929 Other chronic pain: Secondary | ICD-10-CM

## 2022-07-26 MED ORDER — IOPAMIDOL (ISOVUE-300) INJECTION 61%
75.0000 mL | Freq: Once | INTRAVENOUS | Status: AC | PRN
  Administered 2022-07-26: 75 mL via INTRAVENOUS

## 2022-11-28 ENCOUNTER — Other Ambulatory Visit: Payer: Self-pay | Admitting: Radiology

## 2022-11-28 DIAGNOSIS — N946 Dysmenorrhea, unspecified: Secondary | ICD-10-CM

## 2022-11-28 NOTE — Telephone Encounter (Signed)
RF received for Ibuprofen 800mg . Last AEX 12/26/21. No AEX appointment is scheduled. Ok'd RF x's 1 with note:  needs office visit for further refills.

## 2023-03-14 ENCOUNTER — Encounter (INDEPENDENT_AMBULATORY_CARE_PROVIDER_SITE_OTHER): Payer: Self-pay

## 2023-03-19 ENCOUNTER — Ambulatory Visit: Payer: BC Managed Care – PPO | Admitting: Radiology

## 2023-04-25 ENCOUNTER — Encounter: Payer: BC Managed Care – PPO | Admitting: Internal Medicine

## 2023-05-09 ENCOUNTER — Ambulatory Visit: Payer: BC Managed Care – PPO | Admitting: Radiology

## 2024-05-05 ENCOUNTER — Encounter: Payer: Self-pay | Admitting: Radiology

## 2024-05-05 ENCOUNTER — Ambulatory Visit: Admitting: Radiology

## 2024-05-05 VITALS — BP 118/78 | HR 82 | Ht 65.0 in | Wt 180.0 lb

## 2024-05-05 DIAGNOSIS — D500 Iron deficiency anemia secondary to blood loss (chronic): Secondary | ICD-10-CM | POA: Diagnosis not present

## 2024-05-05 DIAGNOSIS — N920 Excessive and frequent menstruation with regular cycle: Secondary | ICD-10-CM | POA: Diagnosis not present

## 2024-05-05 NOTE — Progress Notes (Signed)
.    Bessye Stith [birth year] [id number]   History:  43 y.o. G3P1 presents for follow up fibroids. Ready to discuss surgical options. Last visit was 2023 for u/s to assess heavy menstrual bleeding, pelvic pain showed multiple fibroids. Was started on Accrufer  and Myfembree  for anemia and bleeding which she has since stopped. Could not tolerate OTC iron due to GI side effects.   Gynecologic History Patient's last menstrual period was 04/16/2024 (exact date). Period Duration (Days): 5 Period Pattern: Regular Menstrual Flow: Moderate, Heavy Menstrual Control: Tampon Menstrual Control Change Freq (Hours): 4 Dysmenorrhea: (!) Moderate Dysmenorrhea Symptoms: Cramping, Throbbing Contraception/Family planning: vasectomy  Last Pap:12/26/21 normal Last mammogram: 2022. Results were: normal  Obstetric History OB History  Gravida Para Term Preterm AB Living  3 1 1  2 1   SAB IAB Ectopic Multiple Live Births      1    # Outcome Date GA Lbr Len/2nd Weight Sex Type Anes PTL Lv  3 Term           2 AB           1 AB              The following portions of the patient's history were reviewed and updated as appropriate: allergies, current medications, past family history, past medical history, past social history, past surgical history, and problem list.  Review of Systems Pertinent items noted in HPI and remainder of comprehensive ROS otherwise negative.   Past medical history, past surgical history, family history and social history were all reviewed and documented in the EPIC chart.  Vitals:   05/05/24 1515  BP: 118/78  Pulse: 82  SpO2: 99%  Weight: 180 lb (81.6 kg)  Height: 5' 5 (1.651 m)   Ultrasound 01/23/22 Indication: Menorrhagia, dysmenorrhea  Vaginal scan:  Enlarged, anteverted uterus 11.42cmx9.27cmx7.13cm with a volume of 395.216cm3  Endometrial thickness 3.42mm  Endometrial cavity greatly distorted by adjacent fibroids, no obvious masses, difficult to  evaluate  Average diameter of fibroids  Fibroid 1  2.92cm  Fibroid 2 1.91cm Fibroid 3 3.40cm Fibroid 4  1.11cm Fibroid 5  1.59cm Fibroid 6  2.41cm Fibroid 7  1.16cm Fibroid 8  4.44cm Fibroid 9  3.17cm Fibroid 10 2.83cm    Both ovaries normal size with normal follicle pattern  No adnexal masses or free fluid    Impression: Uterine Fibroids  Assessment/Plan:   1. Menorrhagia with regular cycle (Primary) Schedule consult to discuss hyst with EB  2. Iron deficiency anemia due to chronic blood loss - CBC - Iron, TIBC and Ferritin Panel     Misha Antonini B WHNP-BC 4:11 PM 05/05/2024

## 2024-05-06 ENCOUNTER — Ambulatory Visit: Payer: Self-pay | Admitting: Radiology

## 2024-05-06 DIAGNOSIS — D72820 Lymphocytosis (symptomatic): Secondary | ICD-10-CM

## 2024-05-06 DIAGNOSIS — R790 Abnormal level of blood mineral: Secondary | ICD-10-CM

## 2024-05-06 LAB — CBC
HCT: 39.9 % (ref 35.0–45.0)
Hemoglobin: 12.6 g/dL (ref 11.7–15.5)
MCH: 29.8 pg (ref 27.0–33.0)
MCHC: 31.6 g/dL — ABNORMAL LOW (ref 32.0–36.0)
MCV: 94.3 fL (ref 80.0–100.0)
MPV: 12.1 fL (ref 7.5–12.5)
Platelets: 211 Thousand/uL (ref 140–400)
RBC: 4.23 Million/uL (ref 3.80–5.10)
RDW: 13.6 % (ref 11.0–15.0)
WBC: 4.3 Thousand/uL (ref 3.8–10.8)

## 2024-05-06 LAB — IRON,TIBC AND FERRITIN PANEL
%SAT: 34 % (ref 16–45)
Ferritin: 10 ng/mL — ABNORMAL LOW (ref 16–232)
Iron: 113 ug/dL (ref 40–190)
TIBC: 337 ug/dL (ref 250–450)

## 2024-05-14 ENCOUNTER — Inpatient Hospital Stay: Attending: Oncology | Admitting: Oncology

## 2024-05-14 ENCOUNTER — Encounter: Payer: Self-pay | Admitting: Oncology

## 2024-05-14 ENCOUNTER — Inpatient Hospital Stay

## 2024-05-14 VITALS — BP 139/83 | HR 68 | Temp 97.4°F | Resp 18 | Ht 65.0 in | Wt 183.9 lb

## 2024-05-14 DIAGNOSIS — D519 Vitamin B12 deficiency anemia, unspecified: Secondary | ICD-10-CM | POA: Insufficient documentation

## 2024-05-14 DIAGNOSIS — D709 Neutropenia, unspecified: Secondary | ICD-10-CM | POA: Diagnosis present

## 2024-05-14 DIAGNOSIS — E611 Iron deficiency: Secondary | ICD-10-CM | POA: Insufficient documentation

## 2024-05-14 DIAGNOSIS — D72820 Lymphocytosis (symptomatic): Secondary | ICD-10-CM | POA: Diagnosis not present

## 2024-05-14 DIAGNOSIS — Z862 Personal history of diseases of the blood and blood-forming organs and certain disorders involving the immune mechanism: Secondary | ICD-10-CM | POA: Insufficient documentation

## 2024-05-14 DIAGNOSIS — Z87891 Personal history of nicotine dependence: Secondary | ICD-10-CM | POA: Insufficient documentation

## 2024-05-14 DIAGNOSIS — D72819 Decreased white blood cell count, unspecified: Secondary | ICD-10-CM | POA: Insufficient documentation

## 2024-05-14 LAB — CMP (CANCER CENTER ONLY)
ALT: 15 U/L (ref 0–44)
AST: 23 U/L (ref 15–41)
Albumin: 4.5 g/dL (ref 3.5–5.0)
Alkaline Phosphatase: 44 U/L (ref 38–126)
Anion gap: 8 (ref 5–15)
BUN: 14 mg/dL (ref 6–20)
CO2: 22 mmol/L (ref 22–32)
Calcium: 8.9 mg/dL (ref 8.9–10.3)
Chloride: 104 mmol/L (ref 98–111)
Creatinine: 0.83 mg/dL (ref 0.44–1.00)
GFR, Estimated: >60 mL/min (ref >60)
Glucose, Bld: 82 mg/dL (ref 70–99)
Potassium: 4 mmol/L (ref 3.5–5.1)
Sodium: 134 mmol/L — ABNORMAL LOW (ref 135–145)
Total Bilirubin: 1.1 mg/dL (ref 0.0–1.2)
Total Protein: 7.7 g/dL (ref 6.5–8.1)

## 2024-05-14 LAB — CBC WITH DIFFERENTIAL/PLATELET
Abs Immature Granulocytes: 0.01 K/uL (ref 0.00–0.07)
Basophils Absolute: 0 K/uL (ref 0.0–0.1)
Basophils Relative: 1 %
Eosinophils Absolute: 0 K/uL (ref 0.0–0.5)
Eosinophils Relative: 1 %
HCT: 38.5 % (ref 36.0–46.0)
Hemoglobin: 12.7 g/dL (ref 12.0–15.0)
Immature Granulocytes: 0 %
Lymphocytes Relative: 54 %
Lymphs Abs: 2.1 K/uL (ref 0.7–4.0)
MCH: 29.5 pg (ref 26.0–34.0)
MCHC: 33 g/dL (ref 30.0–36.0)
MCV: 89.3 fL (ref 80.0–100.0)
Monocytes Absolute: 0.3 K/uL (ref 0.1–1.0)
Monocytes Relative: 7 %
Neutro Abs: 1.4 K/uL — ABNORMAL LOW (ref 1.7–7.7)
Neutrophils Relative %: 37 %
Platelets: 193 K/uL (ref 150–400)
RBC: 4.31 MIL/uL (ref 3.87–5.11)
RDW: 14.7 % (ref 11.5–15.5)
WBC: 3.8 K/uL — ABNORMAL LOW (ref 4.0–10.5)
nRBC: 0 % (ref 0.0–0.2)

## 2024-05-14 LAB — TECHNOLOGIST SMEAR REVIEW: Plt Morphology: ADEQUATE

## 2024-05-14 LAB — VITAMIN B12: Vitamin B-12: 381 pg/mL (ref 180–914)

## 2024-05-14 LAB — LACTATE DEHYDROGENASE: LDH: 146 U/L (ref 98–192)

## 2024-05-14 NOTE — Assessment & Plan Note (Addendum)
 Leukocytosis has resolved on most recent blood work with CBC.  No differential was tested. Check CBC, smear, flow cytometry, multiple myeloma panel, light chain ratio.  Today's blood work showed no lymphocytosis.  She has mild leukopenia with predominantly neutropenia.  ANC 1.4.  Pending above workup.
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
## 2024-05-14 NOTE — Assessment & Plan Note (Signed)
 I will check B12 level.

## 2024-05-14 NOTE — Progress Notes (Signed)
 New patient; Referral for Elevated lymphocytes/low serum ferritin level.

## 2024-05-14 NOTE — Assessment & Plan Note (Addendum)
 Mild iron deficiency without anemia. Recommend patient to try over-the-counter iron supplementation.  Potential side effects were reviewed and discussed patient. Plan to repeat blood work in 3 months

## 2024-05-15 LAB — KAPPA/LAMBDA LIGHT CHAINS
Kappa free light chain: 13.1 mg/L (ref 3.3–19.4)
Kappa, lambda light chain ratio: 0.96 (ref 0.26–1.65)
Lambda free light chains: 13.6 mg/L (ref 5.7–26.3)

## 2024-05-19 LAB — MULTIPLE MYELOMA PANEL, SERUM
Albumin SerPl Elph-Mcnc: 4 g/dL (ref 2.9–4.4)
Albumin/Glob SerPl: 1.3 (ref 0.7–1.7)
Alpha 1: 0.2 g/dL (ref 0.0–0.4)
Alpha2 Glob SerPl Elph-Mcnc: 0.6 g/dL (ref 0.4–1.0)
B-Globulin SerPl Elph-Mcnc: 1 g/dL (ref 0.7–1.3)
Gamma Glob SerPl Elph-Mcnc: 1.2 g/dL (ref 0.4–1.8)
Globulin, Total: 3.1 g/dL (ref 2.2–3.9)
IgA: 82 mg/dL — ABNORMAL LOW (ref 87–352)
IgG (Immunoglobin G), Serum: 1346 mg/dL (ref 586–1602)
IgM (Immunoglobulin M), Srm: 93 mg/dL (ref 26–217)
Total Protein ELP: 7.1 g/dL (ref 6.0–8.5)

## 2024-05-20 LAB — COMP PANEL: LEUKEMIA/LYMPHOMA

## 2024-05-21 ENCOUNTER — Encounter: Payer: Self-pay | Admitting: Oncology

## 2024-05-22 ENCOUNTER — Ambulatory Visit: Admitting: Obstetrics and Gynecology

## 2024-05-27 ENCOUNTER — Telehealth: Payer: Self-pay

## 2024-05-27 NOTE — Telephone Encounter (Signed)
 Copied from CRM [id number]. Topic: Appointments - Scheduling Inquiry for Clinic >> May 26, 2024  2:20 PM Brittany M wrote: Reason for CRM: Patient has physical appt on 12/19 and am office visit on 11/24 for surgical clearance- she is wanting to know if the surgical clearance can be done the same day as the physical appt

## 2024-05-28 NOTE — Telephone Encounter (Signed)
 Advised patient that we could do her physical and surgical clearance on the same day. 11/24 appointment has been cancelled.

## 2024-06-16 ENCOUNTER — Ambulatory Visit: Payer: Self-pay | Admitting: Radiology

## 2024-06-17 ENCOUNTER — Ambulatory Visit: Admitting: Internal Medicine

## 2024-06-17 VITALS — BP 142/84 | HR 75 | Temp 98.5°F | Resp 16 | Ht 65.0 in | Wt 181.6 lb

## 2024-06-17 DIAGNOSIS — I10 Essential (primary) hypertension: Secondary | ICD-10-CM

## 2024-06-17 DIAGNOSIS — E66811 Obesity, class 1: Secondary | ICD-10-CM | POA: Diagnosis not present

## 2024-06-17 DIAGNOSIS — Z1231 Encounter for screening mammogram for malignant neoplasm of breast: Secondary | ICD-10-CM | POA: Insufficient documentation

## 2024-06-17 DIAGNOSIS — R110 Nausea: Secondary | ICD-10-CM | POA: Insufficient documentation

## 2024-06-17 DIAGNOSIS — E6609 Other obesity due to excess calories: Secondary | ICD-10-CM | POA: Diagnosis not present

## 2024-06-17 DIAGNOSIS — Z0000 Encounter for general adult medical examination without abnormal findings: Secondary | ICD-10-CM | POA: Diagnosis not present

## 2024-06-17 DIAGNOSIS — R140 Abdominal distension (gaseous): Secondary | ICD-10-CM | POA: Insufficient documentation

## 2024-06-17 DIAGNOSIS — Z0001 Encounter for general adult medical examination with abnormal findings: Secondary | ICD-10-CM | POA: Insufficient documentation

## 2024-06-17 DIAGNOSIS — Z01818 Encounter for other preprocedural examination: Secondary | ICD-10-CM

## 2024-06-17 DIAGNOSIS — Z1211 Encounter for screening for malignant neoplasm of colon: Secondary | ICD-10-CM | POA: Insufficient documentation

## 2024-06-17 DIAGNOSIS — R3121 Asymptomatic microscopic hematuria: Secondary | ICD-10-CM | POA: Diagnosis not present

## 2024-06-17 DIAGNOSIS — R196 Halitosis: Secondary | ICD-10-CM | POA: Insufficient documentation

## 2024-06-17 DIAGNOSIS — Z6830 Body mass index (BMI) 30.0-30.9, adult: Secondary | ICD-10-CM

## 2024-06-17 LAB — LIPID PANEL
Cholesterol: 241 mg/dL — ABNORMAL HIGH (ref 0–200)
HDL: 122.3 mg/dL (ref >39.00)
LDL Cholesterol: 104 mg/dL — ABNORMAL HIGH (ref 0–99)
NonHDL: 118.83
Total CHOL/HDL Ratio: 2
Triglycerides: 75 mg/dL (ref 0.0–149.0)
VLDL: 15 mg/dL (ref 0.0–40.0)

## 2024-06-17 LAB — PROTIME-INR
INR: 1 ratio (ref 0.8–1.0)
Prothrombin Time: 10.2 s (ref 9.6–13.1)

## 2024-06-17 LAB — APTT: aPTT: 36.1 s (ref 25.4–36.8)

## 2024-06-17 LAB — TSH: TSH: 1.4 u[IU]/mL (ref 0.35–5.50)

## 2024-06-17 NOTE — Progress Notes (Addendum)
 Subjective:  Patient ID: Donna Aguilar, female    DOB: 08/22/1980  Age: 43 y.o. MRN: [id number]  CC: Annual Exam, Surgical clearance, and Hypertension   HPI Donna Aguilar presents for a CPX and f/up ---  Discussed the use of AI scribe software for clinical note transcription with the patient, who gave verbal consent to proceed.  History of Present Illness Donna Aguilar is a 43 year old female who presents for a preoperative clearance for cosmetic surgery.  She has taken a year off work due to lack of insurance and is now back with coverage, prompting this visit.  She has undergone a significant weight loss journey, having reduced her weight from 260 pounds to 165 pounds through intermittent fasting and a plant-based diet. She maintains her weight through regular physical activity, including walking three to five miles regularly and strength training with kettlebells. No chest pain, shortness of breath, dizziness, or lightheadedness during these activities.  She experiences sporadic right lower quadrant abdominal pain that occurs only when consuming certain foods, persisting for two to three years. No impact on bowel movements or presence of blood in the stool.  She denies any history of high blood pressure, although Her father has high blood pressure and diabetes. She has never experienced high blood pressure herself, even when she was a smoker.  She does not want to receive a flu shot or COVID vaccine. Her last mammogram was two years ago, and she has not yet considered colon cancer screening.     Outpatient Medications Prior to Visit  Medication Sig Dispense Refill   ascorbic acid (VITAMIN C) 500 MG tablet Take 500 mg by mouth.     Multiple Vitamin (MULTIVITAMIN PO) Take by mouth.     Turmeric (QC TUMERIC COMPLEX PO) Take by mouth.     B Complex-C (B-COMPLEX WITH VITAMIN C) tablet Take by mouth.     ibuprofen  (ADVIL ) 800 MG tablet TAKE 1 TABLET BY MOUTH EVERY 8 HOURS AS  NEEDED 90 tablet 0   No facility-administered medications prior to visit.    ROS Review of Systems  Constitutional:  Negative for appetite change, chills, diaphoresis, fatigue and fever.  HENT: Negative.    Eyes: Negative.   Respiratory:  Negative for cough, chest tightness, shortness of breath and wheezing.   Cardiovascular:  Negative for chest pain, palpitations and leg swelling.  Gastrointestinal:  Positive for abdominal pain. Negative for constipation, diarrhea, nausea and vomiting.  Endocrine: Negative.   Genitourinary: Negative.  Negative for difficulty urinating, dysuria and hematuria.  Musculoskeletal: Negative.   Skin: Negative.   Neurological:  Negative for dizziness, weakness, light-headedness, numbness and headaches.  Hematological:  Negative for adenopathy. Does not bruise/bleed easily.  Psychiatric/Behavioral: Negative.      Objective:  BP (!) 142/84 (BP Location: Left Arm, Patient Position: Sitting, Cuff Size: Normal) Comment: BP (R) 140/96  Pulse 75   Temp 98.5 F (36.9 C) (Oral)   Resp 16   Ht 5' 5 (1.651 m)   Wt 181 lb 9.6 oz (82.4 kg)   LMP 06/08/2024   SpO2 97%   BMI 30.22 kg/m   BP Readings from Last 3 Encounters:  06/17/24 (!) 142/84  05/14/24 139/83  05/05/24 118/78    Wt Readings from Last 3 Encounters:  06/17/24 181 lb 9.6 oz (82.4 kg)  05/14/24 183 lb 14.4 oz (83.4 kg)  05/05/24 180 lb (81.6 kg)    Physical Exam Vitals reviewed.  Constitutional:      Appearance: Normal  appearance.  HENT:     Nose: Nose normal.     Mouth/Throat:     Mouth: Mucous membranes are moist.  Eyes:     General: No scleral icterus.    Conjunctiva/sclera: Conjunctivae normal.  Cardiovascular:     Rate and Rhythm: Normal rate and regular rhythm.     Heart sounds: No murmur heard.    No friction rub. No gallop.     Comments: EKG--- NSR with SA, 66 bpm No LVH, Q waves, or ST/T wave changes  Pulmonary:     Effort: Pulmonary effort is normal.     Breath  sounds: No stridor. No wheezing, rhonchi or rales.  Abdominal:     General: Abdomen is flat. Bowel sounds are normal. There is no distension.     Palpations: There is no mass.     Tenderness: There is no abdominal tenderness. There is no guarding.     Hernia: No hernia is present.  Musculoskeletal:        General: Normal range of motion.     Cervical back: Neck supple.     Right lower leg: No edema.     Left lower leg: No edema.  Lymphadenopathy:     Cervical: No cervical adenopathy.  Skin:    General: Skin is warm and dry.     Findings: No rash.  Neurological:     General: No focal deficit present.     Mental Status: She is alert.  Psychiatric:        Mood and Affect: Mood normal.        Behavior: Behavior normal.     Lab Results  Component Value Date   WBC 3.8 (L) 05/14/2024   HGB 12.7 05/14/2024   HCT 38.5 05/14/2024   PLT 193 05/14/2024   GLUCOSE 82 05/14/2024   CHOL 241 (H) 06/17/2024   TRIG 75.0 06/17/2024   HDL 122.30 06/17/2024   LDLDIRECT 147.5 05/29/2011   LDLCALC 104 (H) 06/17/2024   ALT 15 05/14/2024   AST 23 05/14/2024   NA 134 (L) 05/14/2024   K 4.0 05/14/2024   CL 104 05/14/2024   CREATININE 0.83 05/14/2024   BUN 14 05/14/2024   CO2 22 05/14/2024   TSH 1.40 06/17/2024   INR 1.0 06/17/2024    CT SOFT TISSUE NECK W CONTRAST Result Date: 07/29/2022 CLINICAL DATA:  43 year old female with persistent right side throat pain for 1.5-2 years. Tonsillectomy in 2021. EXAM: CT NECK WITH CONTRAST TECHNIQUE: Multidetector CT imaging of the neck was performed using the standard protocol following the bolus administration of intravenous contrast. RADIATION DOSE REDUCTION: This exam was performed according to the departmental dose-optimization program which includes automated exposure control, adjustment of the mA and/or kV according to patient size and/or use of iterative reconstruction technique. CONTRAST:  75mL ISOVUE -300 IOPAMIDOL  (ISOVUE -300) INJECTION 61%  COMPARISON:  CTA neck 05/24/2014 [facility]. FINDINGS: Pharynx and larynx: Larynx appears symmetric, within normal limits. Epiglottis appears normal. Sequelae of tonsillectomy with decreased tonsillar pillar soft tissue on series 3, image 38 compared to the 2015 CTA. Oropharynx appears normal. Adenoid hypertrophy at the nasopharynx appears mildly progressed since 2015. Some trapped secretions are suspected along the lateral adenoids bilaterally on series 3, image 24. No convincing adenoid fluid collection. And adjacent parapharyngeal spaces remain normal. Retropharyngeal space is within normal limits. Salivary glands: Mild chronic submandibular gland ductal ectasia appears stable since 2015. No sialolithiasis or active gland inflammation identified. Sublingual space is within normal limits. Parotid glands  appear stable and normal. Thyroid : Negative; subcentimeter hypodense posterior left thyroid  nodule Not clinically significant; no follow-up imaging recommended (ref: J Am Coll Radiol. 2015 Feb;12(2): 143-50). Lymph nodes: Chronically increased number of subcentimeter lymph nodes at the bilateral level 2 B nodal stations appear stable since 2015 (series 3, image 31). Subcentimeter retropharyngeal lymph nodes (7 mm on the right series 6, image 27 and 5-6 mm on the left image 33) appear within normal limits. Other bilateral lymph node stations appears stable and normal. No heterogeneous, cystic or necrotic lymph node. Vascular: Major vascular structures in the neck and at the skull base appear patent and within normal limits. Limited intracranial: Negative. Visualized orbits: Negative. Mastoids and visualized paranasal sinuses: Visualized paranasal sinuses and mastoids are clear. Tympanic cavities are clear. Skeleton: Mandible intact with normal alignment and CT appearance of the bilateral TMJ. No dental abnormality identified. No acute osseous abnormality identified. No long gated stylohyoid ligament  calcification on either side. Upper chest: Negative visible superior mediastinum. Lung apices are clear. IMPRESSION: 1. Tonsillectomy since 2015, with some interval adenoid hypertrophy, but no suspicious or complicating features are identified. No explanation for persistent right throat pain. 2. No other acute or inflammatory process identified in the Neck. Cervical lymph nodes - maximal at the bilateral level 2B stations - appear stable since 2015. Electronically Signed   By: VEAR Hurst M.D.   On: 07/29/2022 04:54    Assessment & Plan:  Screening for colon cancer -     Cologuard  Screening mammogram for breast cancer  Primary hypertension- Her BP is not at goal. EKG is negative for LVH. She is not willing to start an anti-hypertensive but agrees to improve her lifestyle modifications. -     TSH; Future -     Urinalysis, Routine w reflex microscopic; Future  Encounter for general adult medical examination with abnormal findings- Exam completed, labs reviewed (statin is not indicated), vaccines reviewed, cancer screenings addressed, pt ed material was given.  -     Lipid panel; Future  Pre-op evaluation -     Protime-INR; Future -     APTT; Future  Asymptomatic microscopic hematuria- Will recheck in 3 months.  Class 1 obesity due to excess calories with serious comorbidity and body mass index (BMI) of 30.0 to 30.9 in adult     Follow-up: Return in about 6 months (around 12/15/2024).  Debby Molt, MD

## 2024-06-17 NOTE — Patient Instructions (Signed)
 Hypertension, Adult High blood pressure (hypertension) is when the force of blood pumping through the arteries is too strong. The arteries are the blood vessels that carry blood from the heart throughout the body. Hypertension forces the heart to work harder to pump blood and may cause arteries to become narrow or stiff. Untreated or uncontrolled hypertension can lead to a heart attack, heart failure, a stroke, kidney disease, and other problems. A blood pressure reading consists of a higher number over a lower number. Ideally, your blood pressure should be below 120/80. The first ("top") number is called the systolic pressure. It is a measure of the pressure in your arteries as your heart beats. The second ("bottom") number is called the diastolic pressure. It is a measure of the pressure in your arteries as the heart relaxes. What are the causes? The exact cause of this condition is not known. There are some conditions that result in high blood pressure. What increases the risk? Certain factors may make you more likely to develop high blood pressure. Some of these risk factors are under your control, including: Smoking. Not getting enough exercise or physical activity. Being overweight. Having too much fat, sugar, calories, or salt (sodium) in your diet. Drinking too much alcohol. Other risk factors include: Having a personal history of heart disease, diabetes, high cholesterol, or kidney disease. Stress. Having a family history of high blood pressure and high cholesterol. Having obstructive sleep apnea. Age. The risk increases with age. What are the signs or symptoms? High blood pressure may not cause symptoms. Very high blood pressure (hypertensive crisis) may cause: Headache. Fast or irregular heartbeats (palpitations). Shortness of breath. Nosebleed. Nausea and vomiting. Vision changes. Severe chest pain, dizziness, and seizures. How is this diagnosed? This condition is diagnosed by  measuring your blood pressure while you are seated, with your arm resting on a flat surface, your legs uncrossed, and your feet flat on the floor. The cuff of the blood pressure monitor will be placed directly against the skin of your upper arm at the level of your heart. Blood pressure should be measured at least twice using the same arm. Certain conditions can cause a difference in blood pressure between your right and left arms. If you have a high blood pressure reading during one visit or you have normal blood pressure with other risk factors, you may be asked to: Return on a different day to have your blood pressure checked again. Monitor your blood pressure at home for 1 week or longer. If you are diagnosed with hypertension, you may have other blood or imaging tests to help your health care provider understand your overall risk for other conditions. How is this treated? This condition is treated by making healthy lifestyle changes, such as eating healthy foods, exercising more, and reducing your alcohol intake. You may be referred for counseling on a healthy diet and physical activity. Your health care provider may prescribe medicine if lifestyle changes are not enough to get your blood pressure under control and if: Your systolic blood pressure is above 130. Your diastolic blood pressure is above 80. Your personal target blood pressure may vary depending on your medical conditions, your age, and other factors. Follow these instructions at home: Eating and drinking  Eat a diet that is high in fiber and potassium, and low in sodium, added sugar, and fat. An example of this eating plan is called the DASH diet. DASH stands for Dietary Approaches to Stop Hypertension. To eat this way: Eat  plenty of fresh fruits and vegetables. Try to fill one half of your plate at each meal with fruits and vegetables. Eat whole grains, such as whole-wheat pasta, brown rice, or whole-grain bread. Fill about one  fourth of your plate with whole grains. Eat or drink low-fat dairy products, such as skim milk or low-fat yogurt. Avoid fatty cuts of meat, processed or cured meats, and poultry with skin. Fill about one fourth of your plate with lean proteins, such as fish, chicken without skin, beans, eggs, or tofu. Avoid pre-made and processed foods. These tend to be higher in sodium, added sugar, and fat. Reduce your daily sodium intake. Many people with hypertension should eat less than 1,500 mg of sodium a day. Do not drink alcohol if: Your health care provider tells you not to drink. You are pregnant, may be pregnant, or are planning to become pregnant. If you drink alcohol: Limit how much you have to: 0-1 drink a day for women. 0-2 drinks a day for men. Know how much alcohol is in your drink. In the [place] one drink equals one 12 oz bottle of beer (355 mL), one 5 oz glass of wine (148 mL), or one 1 oz glass of hard liquor (44 mL). Lifestyle  Work with your health care provider to maintain a healthy body weight or to lose weight. Ask what an ideal weight is for you. Get at least 30 minutes of exercise that causes your heart to beat faster (aerobic exercise) most days of the week. Activities may include walking, swimming, or biking. Include exercise to strengthen your muscles (resistance exercise), such as Pilates or lifting weights, as part of your weekly exercise routine. Try to do these types of exercises for 30 minutes at least 3 days a week. Do not use any products that contain nicotine or tobacco. These products include cigarettes, chewing tobacco, and vaping devices, such as e-cigarettes. If you need help quitting, ask your health care provider. Monitor your blood pressure at home as told by your health care provider. Keep all follow-up visits. This is important. Medicines Take over-the-counter and prescription medicines only as told by your health care provider. Follow directions carefully. Blood  pressure medicines must be taken as prescribed. Do not skip doses of blood pressure medicine. Doing this puts you at risk for problems and can make the medicine less effective. Ask your health care provider about side effects or reactions to medicines that you should watch for. Contact a health care provider if you: Think you are having a reaction to a medicine you are taking. Have headaches that keep coming back (recurring). Feel dizzy. Have swelling in your ankles. Have trouble with your vision. Get help right away if you: Develop a severe headache or confusion. Have unusual weakness or numbness. Feel faint. Have severe pain in your chest or abdomen. Vomit repeatedly. Have trouble breathing. These symptoms may be an emergency. Get help right away. Call 911. Do not wait to see if the symptoms will go away. Do not drive yourself to the hospital. Summary Hypertension is when the force of blood pumping through your arteries is too strong. If this condition is not controlled, it may put you at risk for serious complications. Your personal target blood pressure may vary depending on your medical conditions, your age, and other factors. For most people, a normal blood pressure is less than 120/80. Hypertension is treated with lifestyle changes, medicines, or a combination of both. Lifestyle changes include losing weight, eating a healthy,  low-sodium diet, exercising more, and limiting alcohol. This information is not intended to replace advice given to you by your health care provider. Make sure you discuss any questions you have with your health care provider. Document Revised: 05/23/2021 Document Reviewed: 05/23/2021 Elsevier Patient Education  2024 [employer].

## 2024-06-18 ENCOUNTER — Ambulatory Visit: Payer: Self-pay | Admitting: Internal Medicine

## 2024-06-18 ENCOUNTER — Telehealth: Payer: Self-pay

## 2024-06-18 DIAGNOSIS — R3121 Asymptomatic microscopic hematuria: Secondary | ICD-10-CM | POA: Insufficient documentation

## 2024-06-18 DIAGNOSIS — Z01818 Encounter for other preprocedural examination: Secondary | ICD-10-CM | POA: Insufficient documentation

## 2024-06-18 LAB — URINALYSIS, ROUTINE W REFLEX MICROSCOPIC
Bilirubin Urine: NEGATIVE
Ketones, ur: NEGATIVE
Leukocytes,Ua: NEGATIVE
Nitrite: NEGATIVE
Specific Gravity, Urine: 1.01 (ref 1.000–1.030)
Total Protein, Urine: NEGATIVE
Urine Glucose: NEGATIVE
Urobilinogen, UA: 0.2 (ref 0.0–1.0)
pH: 7.5 (ref 5.0–8.0)

## 2024-06-18 NOTE — Telephone Encounter (Signed)
 Copied from CRM #[id number]. Topic: Clinical - Medical Advice >> Jun 18, 2024 12:57 PM Nessti S wrote: Reason for CRM: pt called because she needs surgical clearance from nurse for 11/19 appt. She can pick up and send herself. Call back number [phone]

## 2024-06-19 NOTE — Telephone Encounter (Signed)
 Patient has been made aware that Dr. Joshua still has her Surgical clearance and once I receive it I will give her a call back to pick It up.

## 2024-06-22 ENCOUNTER — Ambulatory Visit: Admitting: Internal Medicine

## 2024-06-23 ENCOUNTER — Encounter: Payer: Self-pay | Admitting: Internal Medicine

## 2024-06-23 NOTE — Telephone Encounter (Signed)
 Note written

## 2024-06-24 NOTE — Telephone Encounter (Signed)
 Spoke to patient and let her know the note will be upfront for her to pick up. Patient stated she will be by 06/24/2024 to pick up this.

## 2024-07-18 LAB — COLOGUARD: COLOGUARD: NEGATIVE

## 2024-07-24 ENCOUNTER — Ambulatory Visit: Payer: Self-pay | Admitting: Radiology

## 2024-08-10 ENCOUNTER — Telehealth: Payer: Self-pay | Admitting: Oncology

## 2024-08-10 NOTE — Telephone Encounter (Signed)
 Pt called to cancel appts for now - said she has to find time that she can come in and will give us  a call when she able to r/s - [facility]

## 2024-08-12 ENCOUNTER — Inpatient Hospital Stay

## 2024-08-14 ENCOUNTER — Inpatient Hospital Stay: Admitting: Oncology

## 2024-09-02 ENCOUNTER — Encounter: Payer: Self-pay | Admitting: Internal Medicine

## 2024-12-17 ENCOUNTER — Ambulatory Visit: Admitting: Internal Medicine
